# Patient Record
Sex: Female | Born: 1960 | Race: Black or African American | Hispanic: No | Smoking: Former smoker
Health system: Southern US, Community
[De-identification: ages and names within clinical notes are randomized; demographics above are authoritative.]

## PROBLEM LIST (undated history)

## (undated) DIAGNOSIS — I10 Essential (primary) hypertension: Secondary | ICD-10-CM

## (undated) HISTORY — PX: GASTRIC BYPASS: SHX52

---

## 2005-01-12 ENCOUNTER — Ambulatory Visit: Payer: Self-pay | Admitting: Internal Medicine

## 2005-08-24 ENCOUNTER — Ambulatory Visit: Payer: Self-pay | Admitting: Family Medicine

## 2008-02-21 ENCOUNTER — Ambulatory Visit: Payer: Self-pay | Admitting: Family Medicine

## 2012-11-12 ENCOUNTER — Ambulatory Visit: Payer: Self-pay | Admitting: Physician Assistant

## 2012-11-12 LAB — RAPID STREP-A WITH REFLX: Micro Text Report: NEGATIVE

## 2013-11-25 ENCOUNTER — Ambulatory Visit: Payer: Self-pay

## 2013-12-27 ENCOUNTER — Ambulatory Visit: Payer: Self-pay | Admitting: Family Medicine

## 2014-07-30 ENCOUNTER — Other Ambulatory Visit: Payer: Self-pay | Admitting: Family Medicine

## 2014-07-30 DIAGNOSIS — I10 Essential (primary) hypertension: Secondary | ICD-10-CM

## 2014-11-02 ENCOUNTER — Other Ambulatory Visit: Payer: Self-pay | Admitting: Family Medicine

## 2014-11-02 DIAGNOSIS — I10 Essential (primary) hypertension: Secondary | ICD-10-CM

## 2014-11-03 ENCOUNTER — Other Ambulatory Visit: Payer: Self-pay

## 2014-12-09 ENCOUNTER — Other Ambulatory Visit: Payer: Self-pay | Admitting: Family Medicine

## 2015-01-05 ENCOUNTER — Encounter: Payer: Self-pay | Admitting: *Deleted

## 2015-01-05 ENCOUNTER — Other Ambulatory Visit: Payer: Self-pay

## 2015-01-05 ENCOUNTER — Ambulatory Visit
Admission: EM | Admit: 2015-01-05 | Discharge: 2015-01-05 | Disposition: A | Discharge status: Home / Self Care | Payer: Federal, State, Local not specified - PPO | Attending: Registered Nurse | Admitting: Registered Nurse

## 2015-01-05 DIAGNOSIS — H6593 Unspecified nonsuppurative otitis media, bilateral: Secondary | ICD-10-CM

## 2015-01-05 DIAGNOSIS — I10 Essential (primary) hypertension: Secondary | ICD-10-CM

## 2015-01-05 DIAGNOSIS — B9789 Other viral agents as the cause of diseases classified elsewhere: Secondary | ICD-10-CM

## 2015-01-05 DIAGNOSIS — J988 Other specified respiratory disorders: Secondary | ICD-10-CM

## 2015-01-05 DIAGNOSIS — B349 Viral infection, unspecified: Secondary | ICD-10-CM

## 2015-01-05 HISTORY — DX: Essential (primary) hypertension: I10

## 2015-01-05 MED ORDER — CLONIDINE HCL 0.1 MG PO TABS
0.1000 mg | ORAL_TABLET | Freq: Once | ORAL | Status: AC
  Administered 2015-01-05: 0.1 mg via ORAL

## 2015-01-05 MED ORDER — FLUTICASONE PROPIONATE 50 MCG/ACT NA SUSP: 1.0000 | Freq: Two times a day (BID) | NASAL | Status: DC

## 2015-01-05 MED ORDER — LOSARTAN POTASSIUM-HCTZ 100-25 MG PO TABS: 1.0000 | ORAL_TABLET | Freq: Every day | ORAL | Status: DC

## 2015-01-05 MED ORDER — SALINE SPRAY 0.65 % NA SOLN: 2.0000 | NASAL | Status: DC

## 2015-01-05 MED ORDER — CARVEDILOL 3.125 MG PO TABS: 3.1250 mg | ORAL_TABLET | Freq: Two times a day (BID) | ORAL | Status: DC

## 2015-01-05 MED ORDER — AMLODIPINE BESY-BENAZEPRIL HCL 10-20 MG PO CAPS: 1.0000 | ORAL_CAPSULE | Freq: Every day | ORAL | Status: DC

## 2015-01-05 MED ORDER — ACETAMINOPHEN 500 MG PO TABS: 500.0000 mg | ORAL_TABLET | Freq: Four times a day (QID) | ORAL | Status: AC | PRN

## 2015-01-05 NOTE — Discharge Instructions (Signed)
DASH Eating Plan °DASH stands for "Dietary Approaches to Stop Hypertension." The DASH eating plan is a healthy eating plan that has been shown to reduce high blood pressure (hypertension). Additional health benefits may include reducing the risk of type 2 diabetes mellitus, heart disease, and stroke. The DASH eating plan may also help with weight loss. °WHAT DO I NEED TO KNOW ABOUT THE DASH EATING PLAN? °For the DASH eating plan, you will follow these general guidelines: °· Choose foods with a percent daily value for sodium of less than 5% (as listed on the food label). °· Use salt-free seasonings or herbs instead of table salt or sea salt. °· Check with your health care provider or pharmacist before using salt substitutes. °· Eat lower-sodium products, often labeled as "lower sodium" or "no salt added." °· Eat fresh foods. °· Eat more vegetables, fruits, and low-fat dairy products. °· Choose whole grains. Look for the word "whole" as the first word in the ingredient list. °· Choose fish and skinless chicken or turkey more often than red meat. Limit fish, poultry, and meat to 6 oz (170 g) each day. °· Limit sweets, desserts, sugars, and sugary drinks. °· Choose heart-healthy fats. °· Limit cheese to 1 oz (28 g) per day. °· Eat more home-cooked food and less restaurant, buffet, and fast food. °· Limit fried foods. °· Cook foods using methods other than frying. °· Limit canned vegetables. If you do use them, rinse them well to decrease the sodium. °· When eating at a restaurant, ask that your food be prepared with less salt, or no salt if possible. °WHAT FOODS CAN I EAT? °Seek help from a dietitian for individual calorie needs. °Grains °Whole grain or whole wheat bread. Brown rice. Whole grain or whole wheat pasta. Quinoa, bulgur, and whole grain cereals. Low-sodium cereals. Corn or whole wheat flour tortillas. Whole grain cornbread. Whole grain crackers. Low-sodium crackers. °Vegetables °Fresh or frozen vegetables  (raw, steamed, roasted, or grilled). Low-sodium or reduced-sodium tomato and vegetable juices. Low-sodium or reduced-sodium tomato sauce and paste. Low-sodium or reduced-sodium canned vegetables.  °Fruits °All fresh, canned (in natural juice), or frozen fruits. °Meat and Other Protein Products °Ground beef (85% or leaner), grass-fed beef, or beef trimmed of fat. Skinless chicken or turkey. Ground chicken or turkey. Pork trimmed of fat. All fish and seafood. Eggs. Dried beans, peas, or lentils. Unsalted nuts and seeds. Unsalted canned beans. °Dairy °Low-fat dairy products, such as skim or 1% milk, 2% or reduced-fat cheeses, low-fat ricotta or cottage cheese, or plain low-fat yogurt. Low-sodium or reduced-sodium cheeses. °Fats and Oils °Tub margarines without trans fats. Light or reduced-fat mayonnaise and salad dressings (reduced sodium). Avocado. Safflower, olive, or canola oils. Natural peanut or almond butter. °Other °Unsalted popcorn and pretzels. °The items listed above may not be a complete list of recommended foods or beverages. Contact your dietitian for more options. °WHAT FOODS ARE NOT RECOMMENDED? °Grains °White bread. White pasta. White rice. Refined cornbread. Bagels and croissants. Crackers that contain trans fat. °Vegetables °Creamed or fried vegetables. Vegetables in a cheese sauce. Regular canned vegetables. Regular canned tomato sauce and paste. Regular tomato and vegetable juices. °Fruits °Dried fruits. Canned fruit in light or heavy syrup. Fruit juice. °Meat and Other Protein Products °Fatty cuts of meat. Ribs, chicken wings, bacon, sausage, bologna, salami, chitterlings, fatback, hot dogs, bratwurst, and packaged luncheon meats. Salted nuts and seeds. Canned beans with salt. °Dairy °Whole or 2% milk, cream, half-and-half, and cream cheese. Whole-fat or sweetened yogurt. Full-fat   cheeses or blue cheese. Nondairy creamers and whipped toppings. Processed cheese, cheese spreads, or cheese  curds. Condiments Onion and garlic salt, seasoned salt, table salt, and sea salt. Canned and packaged gravies. Worcestershire sauce. Tartar sauce. Barbecue sauce. Teriyaki sauce. Soy sauce, including reduced sodium. Steak sauce. Fish sauce. Oyster sauce. Cocktail sauce. Horseradish. Ketchup and mustard. Meat flavorings and tenderizers. Bouillon cubes. Hot sauce. Tabasco sauce. Marinades. Taco seasonings. Relishes. Fats and Oils Butter, stick margarine, lard, shortening, ghee, and bacon fat. Coconut, palm kernel, or palm oils. Regular salad dressings. Other Pickles and olives. Salted popcorn and pretzels. The items listed above may not be a complete list of foods and beverages to avoid. Contact your dietitian for more information. WHERE CAN I FIND MORE INFORMATION? [employer] [facility] [facility], and [place]: ktime[employer] Hypertension, commonly called high blood pressure, is when the force of blood pumping through your arteries is too strong. Your arteries are the blood vessels that carry blood from your heart throughout your body. A blood pressure reading consists of a higher number over a lower number, such as 110/72. The higher number (systolic) is the pressure inside your arteries when your heart pumps. The lower number (diastolic) is the pressure inside your arteries when your heart relaxes. Ideally you want your blood pressure below 120/80. Hypertension forces your heart to work harder to pump blood. Your arteries may become narrow or stiff. Having untreated or uncontrolled hypertension can cause heart attack, stroke, kidney disease, and other problems. RISK FACTORS Some risk factors for high blood pressure are controllable. Others are not.  Risk factors you cannot control include:  Race. You may be at higher risk if you are African American. Age. Risk increases with age. Gender. Men are at higher risk than women before age 68 years. After age 69, women  are at higher risk than men. Risk factors you can control include: Not getting enough exercise or physical activity. Being overweight. Getting too much fat, sugar, calories, or salt in your diet. Drinking too much alcohol. SIGNS AND SYMPTOMS Hypertension does not usually cause signs or symptoms. Extremely high blood pressure (hypertensive crisis) may cause headache, anxiety, shortness of breath, and nosebleed. DIAGNOSIS To check if you have hypertension, your health care provider will measure your blood pressure while you are seated, with your arm held at the level of your heart. It should be measured at least twice using the same arm. Certain conditions can cause a difference in blood pressure between your right and left arms. A blood pressure reading that is higher than normal on one occasion does not mean that you need treatment. If it is not clear whether you have high blood pressure, you may be asked to return on a different day to have your blood pressure checked again. Or, you may be asked to monitor your blood pressure at home for 1 or more weeks. TREATMENT Treating high blood pressure includes making lifestyle changes and possibly taking medicine. Living a healthy lifestyle can help lower high blood pressure. You may need to change some of your habits. Lifestyle changes may include: Following the DASH diet. This diet is high in fruits, vegetables, and whole grains. It is low in salt, red meat, and added sugars. Keep your sodium intake below 2,300 mg per day. Getting at least 30-45 minutes of aerobic exercise at least 4 times per week. Losing weight if necessary. Not smoking. Limiting alcoholic beverages. Learning ways to reduce stress. Your health care provider may prescribe medicine if lifestyle changes are  not enough to get your blood pressure under control, and if one of the following is true: You are 49-47 years of age and your systolic blood pressure is above 140. You are 55 years  of age or older, and your systolic blood pressure is above 150. Your diastolic blood pressure is above 90. You have diabetes, and your systolic blood pressure is over [id number] or your diastolic blood pressure is over 90. You have kidney disease and your blood pressure is above 140/90. You have heart disease and your blood pressure is above 140/90. Your personal target blood pressure may vary depending on your medical conditions, your age, and other factors. HOME CARE INSTRUCTIONS Have your blood pressure rechecked as directed by your health care provider.  Take medicines only as directed by your health care provider. Follow the directions carefully. Blood pressure medicines must be taken as prescribed. The medicine does not work as well when you skip doses. Skipping doses also puts you at risk for problems. Do not smoke.  Monitor your blood pressure at home as directed by your health care provider. SEEK MEDICAL CARE IF:  You think you are having a reaction to medicines taken. You have recurrent headaches or feel dizzy. You have swelling in your ankles. You have trouble with your vision. SEEK IMMEDIATE MEDICAL CARE IF: You develop a severe headache or confusion. You have unusual weakness, numbness, or feel faint. You have severe chest or abdominal pain. You vomit repeatedly. You have trouble breathing. MAKE SURE YOU:  Understand these instructions. Will watch your condition. Will get help right away if you are not doing well or get worse.   This information is not intended to replace advice given to you by your health care provider. Make sure you discuss any questions you have with your health care provider.   Document Released: 01/31/2005 Document Revised: 06/17/2014 Document Reviewed: 11/23/2012 Elsevier Interactive Patient Education [employer]. dash/   This information is not intended to replace advice given to you by your health care provider. Make sure you discuss any  questions you have with your health care provider.   Document Released: 01/20/2011 Document Revised: 02/21/2014 Document Reviewed: 12/05/2012 Elsevier Interactive Patient Education 2016 [employer] Viral Infections A virus is a type of germ. Viruses can cause:  Minor sore throats.  Aches and pains.  Headaches.  Runny nose.  Rashes.  Watery eyes.  Tiredness.  Coughs.  Loss of appetite.  Feeling sick to your stomach (nausea).  Throwing up (vomiting).  Watery poop (diarrhea). HOME CARE   Only take medicines as told by your doctor.  Drink enough water and fluids to keep your pee (urine) clear or pale yellow. Sports drinks are a good choice.  Get plenty of rest and eat healthy. Soups and broths with crackers or rice are fine. GET HELP RIGHT AWAY IF:   You have a very bad headache.  You have shortness of breath.  You have chest pain or neck pain.  You have an unusual rash.  You cannot stop throwing up.  You have watery poop that does not stop.  You cannot keep fluids down.  You or your child has a temperature by mouth above 102 F (38.9 C), not controlled by medicine.  Your baby is older than 3 months with a rectal temperature of 102 F (38.9 C) or higher.  Your baby is 37 months old or younger with a rectal temperature of 100.4 F (38 C) or higher. MAKE SURE YOU:  Understand these instructions.  Will watch this condition.  Will get help right away if you are not doing well or get worse.   This information is not intended to replace advice given to you by your health care provider. Make sure you discuss any questions you have with your health care provider.   Document Released: 01/14/2008 Document Revised: 04/25/2011 Document Reviewed: 07/09/2014 Elsevier Interactive Patient Education 2016 [employer] Pharyngitis Pharyngitis is redness, pain, and swelling (inflammation) of your pharynx.  CAUSES  Pharyngitis is usually caused by infection.  Most of the time, these infections are from viruses (viral) and are part of a cold. However, sometimes pharyngitis is caused by bacteria (bacterial). Pharyngitis can also be caused by allergies. Viral pharyngitis may be spread from person to person by coughing, sneezing, and personal items or utensils (cups, forks, spoons, toothbrushes). Bacterial pharyngitis may be spread from person to person by more intimate contact, such as kissing.  SIGNS AND SYMPTOMS  Symptoms of pharyngitis include:   Sore throat.   Tiredness (fatigue).   Low-grade fever.   Headache.  Joint pain and muscle aches.  Skin rashes.  Swollen lymph nodes.  Plaque-like film on throat or tonsils (often seen with bacterial pharyngitis). DIAGNOSIS  Your health care provider will ask you questions about your illness and your symptoms. Your medical history, along with a physical exam, is often all that is needed to diagnose pharyngitis. Sometimes, a rapid strep test is done. Other lab tests may also be done, depending on the suspected cause.  TREATMENT  Viral pharyngitis will usually get better in 3-4 days without the use of medicine. Bacterial pharyngitis is treated with medicines that kill germs (antibiotics).  HOME CARE INSTRUCTIONS   Drink enough water and fluids to keep your urine clear or pale yellow.   Only take over-the-counter or prescription medicines as directed by your health care provider:   If you are prescribed antibiotics, make sure you finish them even if you start to feel better.   Do not take aspirin.   Get lots of rest.   Gargle with 8 oz of salt water ( tsp of salt per 1 qt of water) as often as every 1-2 hours to soothe your throat.   Throat lozenges (if you are not at risk for choking) or sprays may be used to soothe your throat. SEEK MEDICAL CARE IF:   You have large, tender lumps in your neck.  You have a rash.  You cough up green, yellow-brown, or bloody spit. SEEK IMMEDIATE  MEDICAL CARE IF:   Your neck becomes stiff.  You drool or are unable to swallow liquids.  You vomit or are unable to keep medicines or liquids down.  You have severe pain that does not go away with the use of recommended medicines.  You have trouble breathing (not caused by a stuffy nose). MAKE SURE YOU:   Understand these instructions.  Will watch your condition.  Will get help right away if you are not doing well or get worse.   This information is not intended to replace advice given to you by your health care provider. Make sure you discuss any questions you have with your health care provider.   Document Released: 01/31/2005 Document Revised: 11/21/2012 Document Reviewed: 10/08/2012 Elsevier Interactive Patient Education 2016 [employer]. Otitis Media With Effusion Otitis media with effusion is the presence of fluid in the middle ear. This is a common problem in children, which often follows ear infections. It may be  present for weeks or longer after the infection. Unlike an acute ear infection, otitis media with effusion refers only to fluid behind the ear drum and not infection. Children with repeated ear and sinus infections and allergy problems are the most likely to get otitis media with effusion. CAUSES  The most frequent cause of the fluid buildup is dysfunction of the eustachian tubes. These are the tubes that drain fluid in the ears to the back of the nose (nasopharynx). SYMPTOMS   The main symptom of this condition is hearing loss. As a result, you or your child may:  Listen to the TV at a loud volume.  Not respond to questions.  Ask "what" often when spoken to.  Mistake or confuse one sound or word for another.  There may be a sensation of fullness or pressure but usually not pain. DIAGNOSIS   Your health care provider will diagnose this condition by examining you or your child's ears.  Your health care provider may test the pressure in you or your  child's ear with a tympanometer.  A hearing test may be conducted if the problem persists. TREATMENT   Treatment depends on the duration and the effects of the effusion.  Antibiotics, decongestants, nose drops, and cortisone-type drugs (tablets or nasal spray) may not be helpful.  Children with persistent ear effusions may have delayed language or behavioral problems. Children at risk for developmental delays in hearing, learning, and speech may require referral to a specialist earlier than children not at risk.  You or your child's health care provider may suggest a referral to an ear, nose, and throat surgeon for treatment. The following may help restore normal hearing:  Drainage of fluid.  Placement of ear tubes (tympanostomy tubes).  Removal of adenoids (adenoidectomy). HOME CARE INSTRUCTIONS   Avoid secondhand smoke.  Infants who are breastfed are less likely to have this condition.  Avoid feeding infants while they are lying flat.  Avoid known environmental allergens.  Avoid people who are sick. SEEK MEDICAL CARE IF:   Hearing is not better in 3 months.  Hearing is worse.  Ear pain.  Drainage from the ear.  Dizziness. MAKE SURE YOU:   Understand these instructions.  Will watch your condition.  Will get help right away if you are not doing well or get worse.   This information is not intended to replace advice given to you by your health care provider. Make sure you discuss any questions you have with your health care provider.   Document Released: 03/10/2004 Document Revised: 02/21/2014 Document Reviewed: 08/28/2012 Elsevier Interactive Patient Education [employer].

## 2015-01-05 NOTE — ED Provider Notes (Signed)
CSN: [id number]     Arrival date & time 01/05/15  1104 History   None    Chief Complaint  Patient presents with  . Sore Throat   (Consider location/radiation/quality/duration/timing/severity/associated sxs/prior Treatment) HPI Comments: Married african [place] female here for evaluation of sore throat that started last night, cold symptoms sneezing, congestion last week has been taking zyrtec and nyquil wihtout relief.  Out of her blood pressure medications x 1 week called [facility] and they stated she needed to see PCM for refill of all of them  Patient reported goal DBP 80 and 1 month ago when taking medications 120/79 has noted some shortness of breath due to cold symptoms/occasional cough nonproductive.  Has noted post nasal drip.  PMHx obesity, hypertension, cardiomegaly  PSH gastric bypass  Patient is a 54 y.o. female presenting with pharyngitis. The history is provided by the patient.  Sore Throat This is a new problem. The current episode started yesterday. The problem occurs constantly. The problem has been gradually worsening. Pertinent negatives include no chest pain, no abdominal pain, no headaches and no shortness of breath. The symptoms are aggravated by eating and drinking. Nothing relieves the symptoms. She has tried rest, food and water for the symptoms. The treatment provided no relief.    Past Medical History  Diagnosis Date  . Hypertension    Past Surgical History  Procedure Laterality Date  . Gastric bypass     Family History  Problem Relation Age of Onset  . Hypertension Mother   . Diabetes Mother   . Hypertension Father   . Diabetes Father    Social History  Substance Use Topics  . Smoking status: Former Research scientist (life sciences)  . Smokeless tobacco: None  . Alcohol Use: 1.2 - 1.8 oz/week    2-3 Glasses of wine per week   OB History    No data available     Review of Systems  Constitutional: Negative for fever, chills, diaphoresis, activity change, appetite change, fatigue  and unexpected weight change.  HENT: Positive for congestion, postnasal drip, sneezing and sore throat. Negative for dental problem, drooling, ear discharge, ear pain, facial swelling, hearing loss, mouth sores, nosebleeds, rhinorrhea, sinus pressure, tinnitus, trouble swallowing and voice change.   Eyes: Negative for photophobia, pain, discharge, redness, itching and visual disturbance.  Respiratory: Positive for cough. Negative for choking, chest tightness, shortness of breath, wheezing and stridor.   Cardiovascular: Negative for chest pain, palpitations and leg swelling.  Gastrointestinal: Negative for nausea, vomiting, abdominal pain, diarrhea, constipation, blood in stool and abdominal distention.  Endocrine: Negative for cold intolerance and heat intolerance.  Genitourinary: Negative for dysuria, hematuria and difficulty urinating.  Musculoskeletal: Negative for myalgias, back pain, joint swelling, arthralgias, gait problem, neck pain and neck stiffness.  Skin: Negative for color change, pallor, rash and wound.  Allergic/Immunologic: Positive for environmental allergies. Negative for food allergies.  Neurological: Negative for dizziness, tremors, seizures, syncope, facial asymmetry, speech difficulty, weakness, light-headedness, numbness and headaches.  Hematological: Negative for adenopathy. Does not bruise/bleed easily.  Psychiatric/Behavioral: Negative for behavioral problems, confusion, sleep disturbance and agitation.    Allergies  Review of patient's allergies indicates no known allergies.  Home Medications   Prior to Admission medications   Medication Sig Start Date End Date Taking? Authorizing Provider  acetaminophen (TYLENOL) 500 MG tablet Take 1 tablet (500 mg total) by mouth every 6 (six) hours as needed for mild pain, moderate pain, fever or headache. 01/05/15 01/12/15  Olen Cordial, NP  fluticasone (FLONASE) 50  MCG/ACT nasal spray Place 1 spray into both nostrils 2  (two) times daily. 01/05/15 01/26/15  Olen Cordial, NP  losartan-hydrochlorothiazide (HYZAAR) 100-25 MG tablet Take 1 tablet by mouth daily. 01/05/15   Olen Cordial, NP  sodium chloride (OCEAN) 0.65 % SOLN nasal spray Place 2 sprays into both nostrils every 2 (two) hours while awake. 01/05/15   Olen Cordial, NP   Meds Ordered and Administered this Visit   Medications  cloNIDine (CATAPRES) tablet 0.1 mg (0.1 mg Oral Given 01/05/15 1255)    BP 180/104 mmHg  Pulse 83  Temp(Src) 98.2 F (36.8 C) (Oral)  Resp 20  Ht 5\' 3"  (1.6 m)  Wt 175 lb (79.379 kg)  BMI 31.01 kg/m2  SpO2 100% No data found.   Physical Exam  Constitutional: She is oriented to person, place, and time. She appears well-developed and well-nourished. She is active and cooperative.  Non-toxic appearance. She does not have a sickly appearance. She appears ill. No distress.  HENT:  Head: Normocephalic and atraumatic.  Right Ear: Hearing, external ear and ear canal normal. A middle ear effusion is present.  Left Ear: Hearing, external ear and ear canal normal. A middle ear effusion is present.  Nose: Mucosal edema and rhinorrhea present. No nose lacerations, sinus tenderness, nasal deformity, septal deviation or nasal septal hematoma. No epistaxis.  No foreign bodies. Right sinus exhibits no maxillary sinus tenderness and no frontal sinus tenderness. Left sinus exhibits no maxillary sinus tenderness and no frontal sinus tenderness.  Mouth/Throat: Uvula is midline and mucous membranes are normal. Mucous membranes are not pale, not dry and not cyanotic. She does not have dentures. No oral lesions. No trismus in the jaw. Normal dentition. No dental abscesses, uvula swelling, lacerations or dental caries. Posterior oropharyngeal edema and posterior oropharyngeal erythema present. No oropharyngeal exudate or tonsillar abscesses.  Bilateral TMs with air fluid level clear; cobblestoning posterior pharynx; bilateral nasal  turbinates with edema/erythema clear discharge  Eyes: Conjunctivae, EOM and lids are normal. Pupils are equal, round, and reactive to light. Right eye exhibits no chemosis, no discharge, no exudate and no hordeolum. No foreign body present in the right eye. Left eye exhibits no chemosis, no discharge, no exudate and no hordeolum. No foreign body present in the left eye. Right conjunctiva is not injected. Right conjunctiva has no hemorrhage. Left conjunctiva is not injected. Left conjunctiva has no hemorrhage. No scleral icterus. Right eye exhibits normal extraocular motion and no nystagmus. Left eye exhibits normal extraocular motion and no nystagmus. Right pupil is round and reactive. Left pupil is round and reactive. Pupils are equal.  Neck: Trachea normal and normal range of motion. Neck supple. No tracheal tenderness, no spinous process tenderness and no muscular tenderness present. No rigidity. No tracheal deviation, no edema, no erythema and normal range of motion present. No thyroid mass and no thyromegaly present.  Cardiovascular: Normal rate, regular rhythm, S1 normal, S2 normal, normal heart sounds and intact distal pulses.  PMI is not displaced.  Exam reveals no gallop and no friction rub.   No murmur heard. Pulses:      Radial pulses are 2+ on the right side, and 2+ on the left side.  Pulmonary/Chest: Effort normal and breath sounds normal. No accessory muscle usage or stridor. No respiratory distress. She has no decreased breath sounds. She has no wheezes. She has no rhonchi. She has no rales. She exhibits no tenderness.  Negative egophany all fields  Abdominal: Soft. She exhibits no  distension.  Musculoskeletal: Normal range of motion. She exhibits no edema or tenderness.       Right shoulder: Normal.       Left shoulder: Normal.       Right hip: Normal.       Left hip: Normal.       Right knee: Normal.       Left knee: Normal.       Cervical back: Normal.       Right hand: Normal.        Left hand: Normal.  Lymphadenopathy:       Head (right side): No submental, no submandibular, no tonsillar, no preauricular, no posterior auricular and no occipital adenopathy present.       Head (left side): No submental, no submandibular, no tonsillar, no preauricular, no posterior auricular and no occipital adenopathy present.    She has no cervical adenopathy.       Right cervical: No superficial cervical, no deep cervical and no posterior cervical adenopathy present.      Left cervical: No superficial cervical, no deep cervical and no posterior cervical adenopathy present.  Neurological: She is alert and oriented to person, place, and time. She has normal strength. She is not disoriented. She displays no atrophy and no tremor. No cranial nerve deficit or sensory deficit. She exhibits normal muscle tone. She displays no seizure activity. Coordination and gait normal. GCS eye subscore is 4. GCS verbal subscore is 5. GCS motor subscore is 6.  Skin: Skin is warm, dry and intact. No abrasion, no bruising, no burn, no ecchymosis, no laceration, no lesion, no petechiae and no rash noted. She is not diaphoretic. No cyanosis or erythema. No pallor. Nails show no clubbing.  Psychiatric: She has a normal mood and affect. Her speech is normal and behavior is normal. Judgment and thought content normal. Cognition and memory are normal.  Nursing note and vitals reviewed.   ED Course  Procedures (including critical care time)  Labs Review Labs Reviewed - No data to display  Imaging Review No results found.  Filed Vitals:   01/05/15 1250 01/05/15 1337  BP: 190/109 180/104  Pulse:    Temp:    Resp:     1255 catapres 0.1mg  po administered by RN Nedra Hai.  Repeat VSS.  ER precautions discussed with patient.  Friend driving and she will be passenger to [place] to visit family this week/weekend.  Patient verbalized understanding of information/instructions, agreed with plan of care and had no  further questions at this time. MDM   1. Essential hypertension   2. Viral respiratory illness   3. Otitis media with effusion, bilateral   refilled blood pressure medications had been out 1 week going out of town tonight through the holidays.  Discussed with patient to contact PCM today to schedule follow up appt for her return to town.  Patient due annual visit per chart review with Dr Ronnald Ramp last visit 27 Dec 2013.  Contacted [facility] [place] and verified patient medications/doses and refills remaining.  Patient only required refil of hyzaar carvedilol and amlodipine both had refills remaining/available.  Non-responsive to catapres in clinic today patient to [facility] and take upon arrival home tonight.  If chest pain, shortness of breath, worst headache of her life to go to ER for re-evaluation.  Avoid adding salt to her foods/avoid concentrated salt in diet.  Continue to monitor blood pressure at home and maintain log of blood pressure and pulse  to bring to follow up appointments.  Continue low sodium diet and exercise program.  Recommended weight loss/weight maintenance to BMI 20-25.  Return to the clinic if any new symptoms.  Patient verbalized agreement and understanding of treatment plan and had no further questions at this time.   P2:  Diet and Exercise specific for HTN  Supportive treatment.   No evidence of invasive bacterial infection, non toxic and well hydrated.  This is most likely self limiting viral infection.  I do not see where any further testing or imaging is necessary at this time.   I will suggest supportive care, rest, good hygiene and encourage the patient to take adequate fluids.  The patient is to return to clinic or EMERGENCY ROOM if symptoms worsen or change significantly e.g. ear pain, fever, purulent discharge from ears or bleeding.  Exitcare handout on otitis media with effusion given to patient.  Patient verbalized agreement and understanding of treatment  plan.    Suspect Viral illness: no evidence of invasive bacterial infection, non toxic and well hydrated.  This is most likely self limiting viral infection.  I do not see where any further testing or imaging is necessary at this time.   I will suggest supportive care, rest, good hygiene and encourage the patient to take adequate fluids.  Does not require work excuse.  Notified patient staff will call with culture results once available next 48+ hours.  Avoid Sudafed, coradedin, OTC cough and cold formulations due to hypertension. flonase 1 spray each nostril BID prn, nasal saline 1-2 sprays each nostril prn q2h, tylenol 1000mg  po QID prn.  Avoid motrin/naproxen use as counteracts blood pressure medications.  Discussed honey with lemon and salt water gargles for comfort also.  The patient is to return to clinic or EMERGENCY ROOM if symptoms worsen or change significantly e.g. fever, lethargy, SOB, wheezing.  Exitcare handout on viral illness given to patient.  Patient verbalized agreement and understanding of treatment plan.     Olen Cordial, NP 01/05/15 2148

## 2015-01-05 NOTE — ED Notes (Signed)
In today for cold like symptons and sore throat but when took bp in both arms bp extremly elevated.  States has been out of bp meds.

## 2015-01-27 ENCOUNTER — Ambulatory Visit (INDEPENDENT_AMBULATORY_CARE_PROVIDER_SITE_OTHER): Payer: Federal, State, Local not specified - PPO | Admitting: Family Medicine

## 2015-01-27 ENCOUNTER — Encounter: Payer: Self-pay | Admitting: Family Medicine

## 2015-01-27 VITALS — BP 140/100 | HR 72 | Ht 63.0 in | Wt 189.0 lb

## 2015-01-27 DIAGNOSIS — I10 Essential (primary) hypertension: Secondary | ICD-10-CM | POA: Diagnosis not present

## 2015-01-27 MED ORDER — CARVEDILOL 3.125 MG PO TABS: 3.1250 mg | ORAL_TABLET | Freq: Every day | ORAL | Status: DC

## 2015-01-27 MED ORDER — AMLODIPINE BESYLATE 10 MG PO TABS: 10.0000 mg | ORAL_TABLET | Freq: Every day | ORAL | Status: DC

## 2015-01-27 MED ORDER — LOSARTAN POTASSIUM-HCTZ 100-25 MG PO TABS: 1.0000 | ORAL_TABLET | Freq: Every day | ORAL | Status: DC

## 2015-01-27 NOTE — Progress Notes (Signed)
Name: Alyssa Hunt   MRN: [id number]    DOB: 03-06-1960   Date:01/27/2015       Progress Note  Subjective  Chief Complaint  Chief Complaint  Patient presents with  . Hypertension    Hypertension This is a chronic problem. The current episode started in the past 7 days. The problem has been gradually worsening since onset. The problem is uncontrolled. Pertinent negatives include no anxiety, blurred vision, chest pain, headaches, malaise/fatigue, neck pain, orthopnea, palpitations, peripheral edema, PND, shortness of breath or sweats. There are no associated agents to hypertension. There are no known risk factors for coronary artery disease. Past treatments include ACE inhibitors, angiotensin blockers, beta blockers, calcium channel blockers and diuretics. The current treatment provides no improvement. Compliance problems: medication dosage.  There is no history of angina, kidney disease, CAD/MI, CVA, heart failure, left ventricular hypertrophy, PVD, renovascular disease or retinopathy. There is no history of chronic renal disease or a hypertension causing med.    No problem-specific assessment & plan notes found for this encounter.   Past Medical History  Diagnosis Date  . Hypertension     Past Surgical History  Procedure Laterality Date  . Gastric bypass      Family History  Problem Relation Age of Onset  . Hypertension Mother   . Diabetes Mother   . Hypertension Father   . Diabetes Father     Social History   Social History  . Marital Status: Married    Spouse Name: N/A  . Number of Children: N/A  . Years of Education: N/A   Occupational History  . Not on file.   Social History Main Topics  . Smoking status: Former Research scientist (life sciences)  . Smokeless tobacco: Not on file  . Alcohol Use: 1.2 - 1.8 oz/week    2-3 Glasses of wine per week  . Drug Use: Not on file  . Sexual Activity: Yes    Birth Control/ Protection: Post-menopausal   Other Topics Concern  . Not on  file   Social History Narrative    No Known Allergies   Review of Systems  Constitutional: Negative for fever, chills, weight loss and malaise/fatigue.  HENT: Negative for ear discharge, ear pain and sore throat.   Eyes: Negative for blurred vision.  Respiratory: Negative for cough, sputum production, shortness of breath and wheezing.   Cardiovascular: Negative for chest pain, palpitations, orthopnea, leg swelling and PND.  Gastrointestinal: Negative for heartburn, nausea, abdominal pain, diarrhea, constipation, blood in stool and melena.  Genitourinary: Negative for dysuria, urgency, frequency and hematuria.  Musculoskeletal: Negative for myalgias, back pain, joint pain and neck pain.  Skin: Negative for rash.  Neurological: Negative for dizziness, tingling, sensory change, focal weakness and headaches.  Endo/Heme/Allergies: Negative for environmental allergies and polydipsia. Does not bruise/bleed easily.  Psychiatric/Behavioral: Negative for depression and suicidal ideas. The patient is not nervous/anxious and does not have insomnia.      Objective  Filed Vitals:   01/27/15 1553  BP: 140/100  Pulse: 72  Height: 5\' 3"  (1.6 m)  Weight: 189 lb (85.73 kg)    Physical Exam  Constitutional: She is well-developed, well-nourished, and in no distress. No distress.  HENT:  Head: Normocephalic and atraumatic.  Right Ear: External ear normal.  Left Ear: External ear normal.  Nose: Nose normal.  Mouth/Throat: Oropharynx is clear and moist.  Eyes: Conjunctivae and EOM are normal. Pupils are equal, round, and reactive to light. Right eye exhibits no discharge. Left eye exhibits no  discharge.  Neck: Normal range of motion. Neck supple. No JVD present. No thyromegaly present.  Cardiovascular: Normal rate, regular rhythm, normal heart sounds and intact distal pulses.  Exam reveals no gallop and no friction rub.   No murmur heard. Pulmonary/Chest: Effort normal and breath sounds normal.   Abdominal: Soft. Bowel sounds are normal. She exhibits no mass. There is no tenderness. There is no guarding.  Musculoskeletal: Normal range of motion. She exhibits no edema.  Lymphadenopathy:    She has no cervical adenopathy.  Neurological: She is alert. She has normal reflexes.  Skin: Skin is warm and dry. She is not diaphoretic.  Psychiatric: Mood and affect normal.      Assessment & Plan  Problem List Items Addressed This Visit    None    Visit Diagnoses    Essential hypertension    -  Primary    Relevant Medications    losartan-hydrochlorothiazide (HYZAAR) 100-25 MG tablet    carvedilol (COREG) 3.125 MG tablet    amLODipine (NORVASC) 10 MG tablet         Dr. [facility]  01/27/2015

## 2015-02-03 ENCOUNTER — Other Ambulatory Visit: Payer: Self-pay | Admitting: Family Medicine

## 2015-03-10 ENCOUNTER — Encounter: Payer: Self-pay | Admitting: Family Medicine

## 2015-03-10 ENCOUNTER — Ambulatory Visit (INDEPENDENT_AMBULATORY_CARE_PROVIDER_SITE_OTHER): Payer: Federal, State, Local not specified - PPO | Admitting: Family Medicine

## 2015-03-10 VITALS — BP 140/102 | HR 80 | Ht 63.0 in | Wt 188.0 lb

## 2015-03-10 DIAGNOSIS — I10 Essential (primary) hypertension: Secondary | ICD-10-CM | POA: Insufficient documentation

## 2015-03-10 DIAGNOSIS — G4700 Insomnia, unspecified: Secondary | ICD-10-CM

## 2015-03-10 DIAGNOSIS — S63602A Unspecified sprain of left thumb, initial encounter: Secondary | ICD-10-CM | POA: Diagnosis not present

## 2015-03-10 MED ORDER — CARVEDILOL 3.125 MG PO TABS
3.1250 mg | ORAL_TABLET | Freq: Two times a day (BID) | ORAL | Status: DC
Start: 2015-03-10 — End: 2015-04-24

## 2015-03-10 MED ORDER — LOSARTAN POTASSIUM-HCTZ 100-25 MG PO TABS: 1.0000 | ORAL_TABLET | Freq: Every day | ORAL | Status: DC

## 2015-03-10 MED ORDER — AMLODIPINE BESYLATE 10 MG PO TABS: 10.0000 mg | ORAL_TABLET | Freq: Every day | ORAL | Status: DC

## 2015-03-10 NOTE — Patient Instructions (Signed)
Insomnia Insomnia is a sleep disorder that makes it difficult to fall asleep or to stay asleep. Insomnia can cause tiredness (fatigue), low energy, difficulty concentrating, mood swings, and poor performance at work or school.  There are three different ways to classify insomnia:  Difficulty falling asleep.  Difficulty staying asleep.  Waking up too early in the morning. Any type of insomnia can be long-term (chronic) or short-term (acute). Both are common. Short-term insomnia usually lasts for three months or less. Chronic insomnia occurs at least three times a week for longer than three months. CAUSES  Insomnia may be caused by another condition, situation, or substance, such as:  Anxiety.  Certain medicines.  Gastroesophageal reflux disease (GERD) or other gastrointestinal conditions.  Asthma or other breathing conditions.  Restless legs syndrome, sleep apnea, or other sleep disorders.  Chronic pain.  Menopause. This may include hot flashes.  Stroke.  Abuse of alcohol, tobacco, or illegal drugs.  Depression.  Caffeine.   Neurological disorders, such as Alzheimer disease.  An overactive thyroid (hyperthyroidism). The cause of insomnia may not be known. RISK FACTORS Risk factors for insomnia include:  Gender. Women are more commonly affected than men.  Age. Insomnia is more common as you get older.  Stress. This may involve your professional or personal life.  Income. Insomnia is more common in people with lower income.  Lack of exercise.   Irregular work schedule or night shifts.  Traveling between different time zones. SIGNS AND SYMPTOMS If you have insomnia, trouble falling asleep or trouble staying asleep is the main symptom. This may lead to other symptoms, such as:  Feeling fatigued.  Feeling nervous about going to sleep.  Not feeling rested in the morning.  Having trouble concentrating.  Feeling irritable, anxious, or depressed. TREATMENT   Treatment for insomnia depends on the cause. If your insomnia is caused by an underlying condition, treatment will focus on addressing the condition. Treatment may also include:   Medicines to help you sleep.  Counseling or therapy.  Lifestyle adjustments. HOME CARE INSTRUCTIONS   Take medicines only as directed by your health care provider.  Keep regular sleeping and waking hours. Avoid naps.  Keep a sleep diary to help you and your health care provider figure out what could be causing your insomnia. Include:   When you sleep.  When you wake up during the night.  How well you sleep.   How rested you feel the next day.  Any side effects of medicines you are taking.  What you eat and drink.   Make your bedroom a comfortable place where it is easy to fall asleep:  Put up shades or special blackout curtains to block light from outside.  Use a white noise machine to block noise.  Keep the temperature cool.   Exercise regularly as directed by your health care provider. Avoid exercising right before bedtime.  Use relaxation techniques to manage stress. Ask your health care provider to suggest some techniques that may work well for you. These may include:  Breathing exercises.  Routines to release muscle tension.  Visualizing peaceful scenes.  Cut back on alcohol, caffeinated beverages, and cigarettes, especially close to bedtime. These can disrupt your sleep.  Do not overeat or eat spicy foods right before bedtime. This can lead to digestive discomfort that can make it hard for you to sleep.  Limit screen use before bedtime. This includes:  Watching TV.  Using your smartphone, tablet, and computer.  Stick to a routine. This   can help you fall asleep faster. Try to do a quiet activity, brush your teeth, and go to bed at the same time each night.  Get out of bed if you are still awake after 15 minutes of trying to sleep. Keep the lights down, but try reading or  doing a quiet activity. When you feel sleepy, go back to bed.  Make sure that you drive carefully. Avoid driving if you feel very sleepy.  Keep all follow-up appointments as directed by your health care provider. This is important. SEEK MEDICAL CARE IF:   You are tired throughout the day or have trouble in your daily routine due to sleepiness.  You continue to have sleep problems or your sleep problems get worse. SEEK IMMEDIATE MEDICAL CARE IF:   You have serious thoughts about hurting yourself or someone else.   This information is not intended to replace advice given to you by your health care provider. Make sure you discuss any questions you have with your health care provider.   Document Released: 01/29/2000 Document Revised: 10/22/2014 Document Reviewed: 11/01/2013 [employer]vier Interactive Patient Education 2016 [employer]  

## 2015-03-10 NOTE — Progress Notes (Signed)
Name: Alyssa Hunt   MRN: [id number]    DOB: 1960/03/02   Date:03/10/2015       Progress Note  Subjective  Chief Complaint  Chief Complaint  Patient presents with  . Hypertension    follow up on B/P   . Hand Pain    bilateral but worse in the L) hand    Hypertension This is a chronic problem. The current episode started more than 1 year ago. The problem has been waxing and waning since onset. The problem is uncontrolled. Pertinent negatives include no anxiety, blurred vision, chest pain, headaches, malaise/fatigue, neck pain, orthopnea, palpitations, peripheral edema, PND, shortness of breath or sweats. There are no associated agents to hypertension. There are no known risk factors for coronary artery disease. Past treatments include angiotensin blockers, calcium channel blockers, beta blockers and diuretics. The current treatment provides moderate improvement. There is no history of angina, kidney disease, CAD/MI, CVA, heart failure, left ventricular hypertrophy, PVD, renovascular disease or retinopathy. There is no history of chronic renal disease or a hypertension causing med.  Hand Pain  The incident occurred more than 1 week ago. There was no injury mechanism. The pain is present in the left hand. The quality of the pain is described as aching. Pertinent negatives include no chest pain or tingling. The symptoms are aggravated by movement. She has tried nothing for the symptoms. The treatment provided no relief.    No problem-specific assessment & plan notes found for this encounter.   Past Medical History  Diagnosis Date  . Hypertension     Past Surgical History  Procedure Laterality Date  . Gastric bypass      Family History  Problem Relation Age of Onset  . Hypertension Mother   . Diabetes Mother   . Hypertension Father   . Diabetes Father     Social History   Social History  . Marital Status: Married    Spouse Name: N/A  . Number of Children: N/A  .  Years of Education: N/A   Occupational History  . Not on file.   Social History Main Topics  . Smoking status: Former Research scientist (life sciences)  . Smokeless tobacco: Not on file  . Alcohol Use: 1.2 - 1.8 oz/week    2-3 Glasses of wine per week  . Drug Use: Not on file  . Sexual Activity: Yes    Birth Control/ Protection: Post-menopausal   Other Topics Concern  . Not on file   Social History Narrative    No Known Allergies   Review of Systems  Constitutional: Negative for fever, chills, weight loss and malaise/fatigue.  HENT: Negative for ear discharge, ear pain and sore throat.   Eyes: Negative for blurred vision.  Respiratory: Negative for cough, hemoptysis, sputum production, shortness of breath and wheezing.   Cardiovascular: Negative for chest pain, palpitations, orthopnea, leg swelling and PND.  Gastrointestinal: Negative for heartburn, nausea, abdominal pain, diarrhea, constipation, blood in stool and melena.  Genitourinary: Negative for dysuria, urgency, frequency and hematuria.  Musculoskeletal: Positive for joint pain. Negative for myalgias, back pain and neck pain.  Skin: Negative for rash.  Neurological: Negative for dizziness, tingling, sensory change, focal weakness and headaches.  Endo/Heme/Allergies: Negative for environmental allergies and polydipsia. Does not bruise/bleed easily.  Psychiatric/Behavioral: Negative for depression and suicidal ideas. The patient is not nervous/anxious and does not have insomnia.      Objective  Filed Vitals:   03/10/15 1612  BP: 140/102  Pulse: 80  Height: 5\' 3"  (  1.6 m)  Weight: 188 lb (85.276 kg)    Physical Exam  Constitutional: She is well-developed, well-nourished, and in no distress. No distress.  HENT:  Head: Normocephalic and atraumatic.  Right Ear: External ear normal.  Left Ear: External ear normal.  Nose: Nose normal.  Mouth/Throat: Oropharynx is clear and moist.  Eyes: Conjunctivae and EOM are normal. Pupils are equal,  round, and reactive to light. Right eye exhibits no discharge. Left eye exhibits no discharge.  Neck: Normal range of motion. Neck supple. No JVD present. No thyromegaly present.  Cardiovascular: Normal rate, regular rhythm, normal heart sounds and intact distal pulses.  Exam reveals no gallop and no friction rub.   No murmur heard. Pulmonary/Chest: Effort normal and breath sounds normal. No respiratory distress. She has no wheezes. She has no rales. She exhibits no tenderness.  Abdominal: Soft. Bowel sounds are normal. She exhibits no mass. There is no tenderness. There is no guarding.  Musculoskeletal: Normal range of motion. She exhibits tenderness. She exhibits no edema.       Left hand: She exhibits tenderness. Normal sensation noted. Normal strength noted.       Hands: Lymphadenopathy:    She has no cervical adenopathy.  Neurological: She is alert. She has normal reflexes.  Skin: Skin is warm and dry. She is not diaphoretic.  Psychiatric: Mood and affect normal.      Assessment & Plan  Problem List Items Addressed This Visit      Cardiovascular and Mediastinum   Essential hypertension - Primary   Relevant Medications   amLODipine (NORVASC) 10 MG tablet   carvedilol (COREG) 3.125 MG tablet   losartan-hydrochlorothiazide (HYZAAR) 100-25 MG tablet    Other Visit Diagnoses    Insomnia        Left thumb sprain, initial encounter             Dr. Mac[facility]  03/10/2015

## 2015-04-21 ENCOUNTER — Ambulatory Visit: Payer: Federal, State, Local not specified - PPO | Admitting: Family Medicine

## 2015-04-24 ENCOUNTER — Ambulatory Visit (INDEPENDENT_AMBULATORY_CARE_PROVIDER_SITE_OTHER): Payer: Federal, State, Local not specified - PPO | Admitting: Family Medicine

## 2015-04-24 ENCOUNTER — Encounter: Payer: Self-pay | Admitting: Family Medicine

## 2015-04-24 VITALS — BP 138/88 | HR 64 | Ht 63.0 in | Wt 196.0 lb

## 2015-04-24 DIAGNOSIS — I10 Essential (primary) hypertension: Secondary | ICD-10-CM | POA: Diagnosis not present

## 2015-04-24 MED ORDER — AMLODIPINE BESYLATE 10 MG PO TABS: 10.0000 mg | ORAL_TABLET | Freq: Every day | ORAL | Status: DC

## 2015-04-24 MED ORDER — CARVEDILOL 3.125 MG PO TABS: 3.1250 mg | ORAL_TABLET | Freq: Two times a day (BID) | ORAL | Status: DC

## 2015-04-24 MED ORDER — LOSARTAN POTASSIUM-HCTZ 100-25 MG PO TABS: 1.0000 | ORAL_TABLET | Freq: Every day | ORAL | Status: DC

## 2015-04-24 NOTE — Progress Notes (Signed)
Name: Alyssa Hunt   MRN: [id number]    DOB: 1960/09/08   Date:04/24/2015       Progress Note  Subjective  Chief Complaint  Chief Complaint  Patient presents with  . Hypertension    follow up on B/P med    Hypertension This is a chronic problem. The current episode started more than 1 year ago. The problem has been gradually improving since onset. The problem is controlled. Pertinent negatives include no anxiety, blurred vision, chest pain, headaches, malaise/fatigue, neck pain, orthopnea, palpitations, peripheral edema, PND, shortness of breath or sweats. There are no associated agents to hypertension. Past treatments include angiotensin blockers, calcium channel blockers, diuretics and beta blockers. The current treatment provides mild improvement. There are no compliance problems.  There is no history of angina, kidney disease, CAD/MI, CVA, heart failure, left ventricular hypertrophy, PVD, renovascular disease or retinopathy. There is no history of chronic renal disease or a hypertension causing med.    No problem-specific assessment & plan notes found for this encounter.   Past Medical History  Diagnosis Date  . Hypertension     Past Surgical History  Procedure Laterality Date  . Gastric bypass      Family History  Problem Relation Age of Onset  . Hypertension Mother   . Diabetes Mother   . Hypertension Father   . Diabetes Father     Social History   Social History  . Marital Status: Married    Spouse Name: N/A  . Number of Children: N/A  . Years of Education: N/A   Occupational History  . Not on file.   Social History Main Topics  . Smoking status: Former Research scientist (life sciences)  . Smokeless tobacco: Not on file  . Alcohol Use: 1.2 - 1.8 oz/week    2-3 Glasses of wine per week  . Drug Use: Not on file  . Sexual Activity: Yes    Birth Control/ Protection: Post-menopausal   Other Topics Concern  . Not on file   Social History Narrative    No Known  Allergies   Review of Systems  Constitutional: Negative for fever, chills, weight loss and malaise/fatigue.  HENT: Negative for ear discharge, ear pain and sore throat.   Eyes: Negative for blurred vision.  Respiratory: Negative for cough, sputum production, shortness of breath and wheezing.   Cardiovascular: Negative for chest pain, palpitations, orthopnea, leg swelling and PND.  Gastrointestinal: Negative for heartburn, nausea, abdominal pain, diarrhea, constipation, blood in stool and melena.  Genitourinary: Negative for dysuria, urgency, frequency and hematuria.  Musculoskeletal: Negative for myalgias, back pain, joint pain and neck pain.  Skin: Negative for rash.  Neurological: Negative for dizziness, tingling, sensory change, focal weakness and headaches.  Endo/Heme/Allergies: Negative for environmental allergies and polydipsia. Does not bruise/bleed easily.  Psychiatric/Behavioral: Negative for depression and suicidal ideas. The patient is not nervous/anxious and does not have insomnia.      Objective  Filed Vitals:   04/24/15 0920  BP: 138/88  Pulse: 64  Height: 5\' 3"  (1.6 m)  Weight: 196 lb (88.905 kg)    Physical Exam  Constitutional: She is well-developed, well-nourished, and in no distress. No distress.  HENT:  Head: Normocephalic and atraumatic.  Right Ear: External ear normal.  Left Ear: External ear normal.  Nose: Nose normal.  Mouth/Throat: Oropharynx is clear and moist.  Eyes: Conjunctivae and EOM are normal. Pupils are equal, round, and reactive to light. Right eye exhibits no discharge. Left eye exhibits no discharge.  Neck:  Normal range of motion. Neck supple. No JVD present. No thyromegaly present.  Cardiovascular: Normal rate, regular rhythm, normal heart sounds and intact distal pulses.  Exam reveals no gallop and no friction rub.   No murmur heard. Pulmonary/Chest: Effort normal and breath sounds normal.  Abdominal: Soft. Bowel sounds are normal. She  exhibits no mass. There is no tenderness. There is no guarding.  Musculoskeletal: Normal range of motion. She exhibits no edema.  Lymphadenopathy:    She has no cervical adenopathy.  Neurological: She is alert.  Skin: Skin is warm and dry. She is not diaphoretic.  Psychiatric: Mood and affect normal.      Assessment & Plan  Problem List Items Addressed This Visit      Cardiovascular and Mediastinum   Essential hypertension - Primary   Relevant Medications   carvedilol (COREG) 3.125 MG tablet   amLODipine (NORVASC) 10 MG tablet   losartan-hydrochlorothiazide (HYZAAR) 100-25 MG tablet   Other Relevant Orders   Renal Function Panel        Dr. Otilio Miu [facility]  04/24/2015

## 2015-04-25 LAB — RENAL FUNCTION PANEL
ALBUMIN: 3.8 g/dL (ref 3.5–5.5)
BUN/Creatinine Ratio: 19 (ref 9–23)
BUN: 22 mg/dL (ref 6–24)
CALCIUM: 9 mg/dL (ref 8.7–10.2)
CHLORIDE: 101 mmol/L (ref 96–106)
CO2: 26 mmol/L (ref 18–29)
Creatinine, Ser: 1.15 mg/dL — ABNORMAL HIGH (ref 0.57–1.00)
GFR calc non Af Amer: 54 mL/min/{1.73_m2} — ABNORMAL LOW (ref >59)
GFR, EST AFRICAN AMERICAN: 62 mL/min/{1.73_m2} (ref >59)
GLUCOSE: 130 mg/dL — AB (ref 65–99)
POTASSIUM: 3.9 mmol/L (ref 3.5–5.2)
Phosphorus: 3.2 mg/dL (ref 2.5–4.5)
Sodium: 141 mmol/L (ref 134–144)

## 2015-05-10 ENCOUNTER — Other Ambulatory Visit: Payer: Self-pay | Admitting: Family Medicine

## 2015-07-26 ENCOUNTER — Ambulatory Visit
Admission: EM | Admit: 2015-07-26 | Discharge: 2015-07-26 | Disposition: A | Discharge status: Home / Self Care | Payer: Federal, State, Local not specified - PPO | Attending: Family Medicine | Admitting: Family Medicine

## 2015-07-26 DIAGNOSIS — J0110 Acute frontal sinusitis, unspecified: Secondary | ICD-10-CM

## 2015-07-26 MED ORDER — AMOXICILLIN 875 MG PO TABS: 875.0000 mg | ORAL_TABLET | Freq: Two times a day (BID) | ORAL | Status: DC

## 2015-07-26 NOTE — ED Provider Notes (Signed)
CSN: [id number]     Arrival date & time 07/26/15  1153 History   First MD Initiated Contact with Patient 07/26/15 1228     Chief Complaint  Patient presents with  . Sinusitis   (Consider location/radiation/quality/duration/timing/severity/associated sxs/prior Treatment) Patient is a 55 y.o. female presenting with URI. The history is provided by the patient.  URI Presenting symptoms: congestion, ear pain, facial pain, fatigue and fever   Severity:  Moderate Onset quality:  Sudden Duration:  1 week Timing:  Constant Progression:  Worsening Chronicity:  New Relieved by:  Nothing Ineffective treatments:  OTC medications Associated symptoms: headaches and sinus pain   Associated symptoms: no wheezing   Risk factors: sick contacts   Risk factors: not elderly, no chronic cardiac disease, no chronic kidney disease, no chronic respiratory disease, no diabetes mellitus, no immunosuppression, no recent illness and no recent travel     Past Medical History  Diagnosis Date  . Hypertension    Past Surgical History  Procedure Laterality Date  . Gastric bypass    . Cesarean section  1998   Family History  Problem Relation Age of Onset  . Hypertension Mother   . Diabetes Mother   . Hypertension Father   . Diabetes Father    Social History  Substance Use Topics  . Smoking status: Former Research scientist (life sciences)  . Smokeless tobacco: None  . Alcohol Use: 1.2 - 1.8 oz/week    2-3 Glasses of wine per week   OB History    No data available     Review of Systems  Constitutional: Positive for fever and fatigue.  HENT: Positive for congestion and ear pain.   Respiratory: Negative for wheezing.   Neurological: Positive for headaches.    Allergies  Review of patient's allergies indicates no known allergies.  Home Medications   Prior to Admission medications   Medication Sig Start Date End Date Taking? Authorizing Provider  amLODipine (NORVASC) 10 MG tablet Take 1 tablet (10 mg total) by mouth  daily. 04/24/15  Yes Juline Patch, MD  carvedilol (COREG) 3.125 MG tablet Take 1 tablet (3.125 mg total) by mouth 2 (two) times daily with a meal. Dr Nehemiah Massed 04/24/15  Yes Juline Patch, MD  losartan-hydrochlorothiazide (HYZAAR) 100-25 MG tablet Take 1 tablet by mouth daily. 04/24/15  Yes Juline Patch, MD  amoxicillin (AMOXIL) 875 MG tablet Take 1 tablet (875 mg total) by mouth 2 (two) times daily. 07/26/15   Norval Gable, MD  fluticasone (FLONASE) 50 MCG/ACT nasal spray Place 1 spray into both nostrils 2 (two) times daily. 01/05/15 01/26/15  Olen Cordial, NP  losartan-hydrochlorothiazide (HYZAAR) 100-25 MG tablet TAKE 1 TABLET BY MOUTH DAILY. 05/12/15   Juline Patch, MD   Meds Ordered and Administered this Visit  Medications - No data to display  BP 170/95 mmHg  Pulse 79  Temp(Src) 97.6 F (36.4 C) (Tympanic)  Resp 16  Ht 5\' 3"  (1.6 m)  Wt 172 lb (78.019 kg)  BMI 30.48 kg/m2  SpO2 97% No data found.   Physical Exam  Constitutional: She appears well-developed and well-nourished. No distress.  HENT:  Head: Normocephalic and atraumatic.  Right Ear: Tympanic membrane, external ear and ear canal normal.  Left Ear: Tympanic membrane, external ear and ear canal normal.  Nose: Mucosal edema and rhinorrhea present. No nose lacerations, sinus tenderness, nasal deformity, septal deviation or nasal septal hematoma. No epistaxis.  No foreign bodies. Right sinus exhibits maxillary sinus tenderness and frontal sinus tenderness. Left  sinus exhibits maxillary sinus tenderness and frontal sinus tenderness.  Mouth/Throat: Uvula is midline, oropharynx is clear and moist and mucous membranes are normal. No oropharyngeal exudate.  Eyes: Conjunctivae and EOM are normal. Pupils are equal, round, and reactive to light. Right eye exhibits no discharge. Left eye exhibits no discharge. No scleral icterus.  Neck: Normal range of motion. Neck supple. No thyromegaly present.  Cardiovascular: Normal rate,  regular rhythm and normal heart sounds.   Pulmonary/Chest: Effort normal and breath sounds normal. No respiratory distress. She has no wheezes. She has no rales.  Lymphadenopathy:    She has no cervical adenopathy.  Skin: She is not diaphoretic.  Nursing note and vitals reviewed.   ED Course  Procedures (including critical care time)  Labs Review Labs Reviewed - No data to display  Imaging Review No results found.   Visual Acuity Review  Right Eye Distance:   Left Eye Distance:   Bilateral Distance:    Right Eye Near:   Left Eye Near:    Bilateral Near:         MDM   1. Acute frontal sinusitis, recurrence not specified    Discharge Medication List as of 07/26/2015 12:35 PM    START taking these medications   Details  amoxicillin (AMOXIL) 875 MG tablet Take 1 tablet (875 mg total) by mouth 2 (two) times daily., Starting 07/26/2015, Until Discontinued, Normal       .1. diagnosis reviewed with patient 2. rx as per orders above; reviewed possible side effects, interactions, risks and benefits  3. Follow-up prn if symptoms worsen or don't improve    Norval Gable, MD 07/26/15 1244

## 2015-07-26 NOTE — ED Notes (Signed)
Patient complains of sinus pain and pressure with headache that started 3 days ago. Patient states that she has also been having ear fullness and fatigue. Patient states that she also has a cough.

## 2015-07-26 NOTE — Discharge Instructions (Signed)

## 2016-03-07 ENCOUNTER — Other Ambulatory Visit: Payer: Self-pay

## 2016-03-07 DIAGNOSIS — I10 Essential (primary) hypertension: Secondary | ICD-10-CM

## 2016-03-07 MED ORDER — AMLODIPINE BESYLATE 10 MG PO TABS: 10.0000 mg | ORAL_TABLET | Freq: Every day | ORAL | 0 refills | Status: DC

## 2016-03-14 ENCOUNTER — Encounter: Payer: Self-pay | Admitting: Family Medicine

## 2016-03-14 ENCOUNTER — Ambulatory Visit (INDEPENDENT_AMBULATORY_CARE_PROVIDER_SITE_OTHER): Payer: Federal, State, Local not specified - PPO | Admitting: Family Medicine

## 2016-03-14 VITALS — BP 140/90 | HR 80 | Ht 63.0 in | Wt 173.0 lb

## 2016-03-14 DIAGNOSIS — J309 Allergic rhinitis, unspecified: Secondary | ICD-10-CM

## 2016-03-14 DIAGNOSIS — I10 Essential (primary) hypertension: Secondary | ICD-10-CM | POA: Diagnosis not present

## 2016-03-14 MED ORDER — FLUTICASONE PROPIONATE 50 MCG/ACT NA SUSP: 1.0000 | Freq: Two times a day (BID) | NASAL | 11 refills | Status: DC

## 2016-03-14 MED ORDER — CARVEDILOL 3.125 MG PO TABS: 3.1250 mg | ORAL_TABLET | Freq: Two times a day (BID) | ORAL | 5 refills | Status: DC

## 2016-03-14 MED ORDER — AMLODIPINE BESYLATE 10 MG PO TABS: 10.0000 mg | ORAL_TABLET | Freq: Every day | ORAL | 5 refills | Status: DC

## 2016-03-14 MED ORDER — LOSARTAN POTASSIUM-HCTZ 100-25 MG PO TABS: 1.0000 | ORAL_TABLET | Freq: Every day | ORAL | 5 refills | Status: DC

## 2016-03-14 NOTE — Progress Notes (Signed)
Name: Alyssa Hunt   MRN: [id number]    DOB: 1960/05/11   Date:03/14/2016       Progress Note  Subjective  Chief Complaint  Chief Complaint  Patient presents with  . Hypertension    hasn't had med this morning    Hypertension  This is a chronic problem. The current episode started more than 1 year ago. The problem has been waxing and waning since onset. The problem is controlled. Pertinent negatives include no anxiety, blurred vision, chest pain, headaches, malaise/fatigue, neck pain, orthopnea, palpitations, peripheral edema, PND, shortness of breath or sweats. There are no associated agents to hypertension. Past treatments include angiotensin blockers, calcium channel blockers, beta blockers and diuretics. The current treatment provides moderate improvement. There are no compliance problems.  There is no history of angina, kidney disease, CAD/MI, CVA, heart failure, left ventricular hypertrophy, PVD or retinopathy. There is no history of chronic renal disease or a hypertension causing med.    No problem-specific Assessment & Plan notes found for this encounter.   Past Medical History:  Diagnosis Date  . Hypertension     Past Surgical History:  Procedure Laterality Date  . CESAREAN SECTION  1998  . GASTRIC BYPASS      Family History  Problem Relation Age of Onset  . Hypertension Mother   . Diabetes Mother   . Hypertension Father   . Diabetes Father     Social History   Social History  . Marital status: Married    Spouse name: N/A  . Number of children: N/A  . Years of education: N/A   Occupational History  . Not on file.   Social History Main Topics  . Smoking status: Former Research scientist (life sciences)  . Smokeless tobacco: Never Used  . Alcohol use 1.2 - 1.8 oz/week    2 - 3 Glasses of wine per week  . Drug use: No  . Sexual activity: Yes    Birth control/ protection: Post-menopausal   Other Topics Concern  . Not on file   Social History Narrative  . No narrative  on file    No Known Allergies   Review of Systems  Constitutional: Negative for chills, fever, malaise/fatigue and weight loss.  HENT: Negative for ear discharge, ear pain and sore throat.   Eyes: Negative for blurred vision.  Respiratory: Negative for cough, sputum production, shortness of breath and wheezing.   Cardiovascular: Negative for chest pain, palpitations, orthopnea, leg swelling and PND.  Gastrointestinal: Negative for abdominal pain, blood in stool, constipation, diarrhea, heartburn, melena and nausea.  Genitourinary: Negative for dysuria, frequency, hematuria and urgency.  Musculoskeletal: Negative for back pain, joint pain, myalgias and neck pain.  Skin: Negative for rash.  Neurological: Negative for dizziness, tingling, sensory change, focal weakness and headaches.  Endo/Heme/Allergies: Negative for environmental allergies and polydipsia. Does not bruise/bleed easily.  Psychiatric/Behavioral: Negative for depression and suicidal ideas. The patient is not nervous/anxious and does not have insomnia.      Objective  Vitals:   03/14/16 0915  BP: 140/90  Pulse: 80  Weight: 173 lb (78.5 kg)  Height: 5\' 3"  (1.6 m)    Physical Exam  Constitutional: She is well-developed, well-nourished, and in no distress. No distress.  HENT:  Head: Normocephalic and atraumatic.  Right Ear: Tympanic membrane, external ear and ear canal normal.  Left Ear: Tympanic membrane, external ear and ear canal normal.  Nose: Nose normal.  Mouth/Throat: Oropharynx is clear and moist.  Eyes: Conjunctivae and EOM are normal.  Pupils are equal, round, and reactive to light. Right eye exhibits no discharge. Left eye exhibits no discharge.  Neck: Normal range of motion. Neck supple. No JVD present. No thyromegaly present.  Cardiovascular: Normal rate, regular rhythm, S1 normal, S2 normal and intact distal pulses.  Exam reveals no gallop, no S3, no S4 and no friction rub.   Murmur heard.  Systolic  murmur is present with a grade of 2/6  Pulmonary/Chest: Effort normal and breath sounds normal.  Abdominal: Soft. Bowel sounds are normal. She exhibits no mass. There is no tenderness. There is no guarding.  Musculoskeletal: Normal range of motion. She exhibits no edema.  Lymphadenopathy:    She has no cervical adenopathy.  Neurological: She is alert. She has normal reflexes.  Skin: Skin is warm and dry. She is not diaphoretic.  Psychiatric: Mood and affect normal.  Nursing note and vitals reviewed.     Assessment & Plan  Problem List Items Addressed This Visit      Cardiovascular and Mediastinum   Essential hypertension   Relevant Medications   losartan-hydrochlorothiazide (HYZAAR) 100-25 MG tablet   carvedilol (COREG) 3.125 MG tablet   amLODipine (NORVASC) 10 MG tablet   Other Relevant Orders   Renal Function Panel    Other Visit Diagnoses    Chronic allergic rhinitis, unspecified seasonality, unspecified trigger    -  Primary   Relevant Medications   fluticasone (FLONASE) 50 MCG/ACT nasal spray        Dr. Collie Kittel [facility]  03/14/16

## 2016-03-15 LAB — RENAL FUNCTION PANEL
ALBUMIN: 4.2 g/dL (ref 3.5–5.5)
BUN/Creatinine Ratio: 18 (ref 9–23)
BUN: 30 mg/dL — ABNORMAL HIGH (ref 6–24)
CALCIUM: 9.5 mg/dL (ref 8.7–10.2)
CO2: 26 mmol/L (ref 18–29)
CREATININE: 1.63 mg/dL — AB (ref 0.57–1.00)
Chloride: 100 mmol/L (ref 96–106)
GFR calc Af Amer: 41 mL/min/{1.73_m2} — ABNORMAL LOW (ref >59)
GFR, EST NON AFRICAN AMERICAN: 35 mL/min/{1.73_m2} — AB (ref >59)
Glucose: 81 mg/dL (ref 65–99)
PHOSPHORUS: 3.8 mg/dL (ref 2.5–4.5)
Potassium: 4.4 mmol/L (ref 3.5–5.2)
SODIUM: 142 mmol/L (ref 134–144)

## 2016-03-17 ENCOUNTER — Other Ambulatory Visit: Payer: Self-pay | Admitting: Family Medicine

## 2016-03-17 DIAGNOSIS — I10 Essential (primary) hypertension: Secondary | ICD-10-CM

## 2016-09-12 ENCOUNTER — Ambulatory Visit: Payer: Federal, State, Local not specified - PPO | Admitting: Family Medicine

## 2016-09-22 ENCOUNTER — Ambulatory Visit (INDEPENDENT_AMBULATORY_CARE_PROVIDER_SITE_OTHER): Payer: Federal, State, Local not specified - PPO | Admitting: Family Medicine

## 2016-09-22 ENCOUNTER — Encounter: Payer: Self-pay | Admitting: Family Medicine

## 2016-09-22 VITALS — BP 120/70 | HR 80 | Ht 63.0 in | Wt 206.0 lb

## 2016-09-22 DIAGNOSIS — I10 Essential (primary) hypertension: Secondary | ICD-10-CM | POA: Diagnosis not present

## 2016-09-22 MED ORDER — LOSARTAN POTASSIUM-HCTZ 100-25 MG PO TABS: 1.0000 | ORAL_TABLET | Freq: Every day | ORAL | 5 refills | Status: DC

## 2016-09-22 MED ORDER — AMLODIPINE BESYLATE 10 MG PO TABS: 10.0000 mg | ORAL_TABLET | Freq: Every day | ORAL | 5 refills | Status: DC

## 2016-09-22 MED ORDER — CARVEDILOL 3.125 MG PO TABS
3.1250 mg | ORAL_TABLET | Freq: Two times a day (BID) | ORAL | 5 refills | Status: DC
Start: 2016-09-22 — End: 2017-12-26

## 2016-09-22 NOTE — Progress Notes (Signed)
Name: Alyssa Hunt   MRN: [id number]    DOB: [birth year]   Date:09/22/2016       Progress Note  Subjective  Chief Complaint  Chief Complaint  Patient presents with  . Hypertension    Hypertension  This is a chronic problem. The current episode started more than 1 year ago. The problem is unchanged. The problem is controlled. Pertinent negatives include no anxiety, blurred vision, chest pain, headaches, malaise/fatigue, neck pain, orthopnea, palpitations, peripheral edema, PND, shortness of breath or sweats. There are no associated agents to hypertension. There are no known risk factors for coronary artery disease. Past treatments include calcium channel blockers, diuretics and beta blockers. The current treatment provides moderate improvement. There are no compliance problems.  There is no history of angina, kidney disease, CAD/MI, CVA, heart failure, left ventricular hypertrophy, PVD or retinopathy. There is no history of chronic renal disease, hyperaldosteronism or a hypertension causing med.    No problem-specific Assessment & Plan notes found for this encounter.   Past Medical History:  Diagnosis Date  . Hypertension     Past Surgical History:  Procedure Laterality Date  . CESAREAN SECTION  1998  . GASTRIC BYPASS      Family History  Problem Relation Age of Onset  . Hypertension Mother   . Diabetes Mother   . Hypertension Father   . Diabetes Father     Social History   Social History  . Marital status: Married    Spouse name: N/A  . Number of children: N/A  . Years of education: N/A   Occupational History  . Not on file.   Social History Main Topics  . Smoking status: Former Research scientist (life sciences)  . Smokeless tobacco: Never Used  . Alcohol use 1.2 - 1.8 oz/week    2 - 3 Glasses of wine per week  . Drug use: No  . Sexual activity: Yes    Birth control/ protection: Post-menopausal   Other Topics Concern  . Not on file   Social History Narrative  . No narrative  on file    No Known Allergies  Outpatient Medications Prior to Visit  Medication Sig Dispense Refill  . amLODipine (NORVASC) 10 MG tablet Take 1 tablet (10 mg total) by mouth daily. 30 tablet 5  . carvedilol (COREG) 3.125 MG tablet Take 1 tablet (3.125 mg total) by mouth 2 (two) times daily with a meal. Dr Nehemiah Massed 60 tablet 5  . losartan-hydrochlorothiazide (HYZAAR) 100-25 MG tablet Take 1 tablet by mouth daily. 30 tablet 5  . fluticasone (FLONASE) 50 MCG/ACT nasal spray Place 1 spray into both nostrils 2 (two) times daily. 16 g 11  . carvedilol (COREG) 3.125 MG tablet TAKE 1 TABLET (3.125 MG TOTAL) BY MOUTH DAILY. 60 tablet 4   No facility-administered medications prior to visit.     Review of Systems  Constitutional: Negative for chills, fever, malaise/fatigue and weight loss.  HENT: Negative for ear discharge, ear pain and sore throat.   Eyes: Negative for blurred vision.  Respiratory: Negative for cough, sputum production, shortness of breath and wheezing.   Cardiovascular: Negative for chest pain, palpitations, orthopnea, leg swelling and PND.  Gastrointestinal: Negative for abdominal pain, blood in stool, constipation, diarrhea, heartburn, melena and nausea.  Genitourinary: Negative for dysuria, frequency, hematuria and urgency.  Musculoskeletal: Negative for back pain, joint pain, myalgias and neck pain.  Skin: Negative for rash.  Neurological: Negative for dizziness, tingling, sensory change, focal weakness and headaches.  Endo/Heme/Allergies: Negative for environmental  allergies and polydipsia. Does not bruise/bleed easily.  Psychiatric/Behavioral: Negative for depression and suicidal ideas. The patient is not nervous/anxious and does not have insomnia.      Objective  Vitals:   09/22/16 1533  BP: 120/70  Pulse: 80  Weight: 206 lb (93.4 kg)  Height: 5\' 3"  (1.6 m)    Physical Exam  Constitutional: She is well-developed, well-nourished, and in no distress. No  distress.  HENT:  Head: Normocephalic and atraumatic.  Right Ear: Tympanic membrane, external ear and ear canal normal.  Left Ear: Tympanic membrane, external ear and ear canal normal.  Nose: Nose normal.  Mouth/Throat: Oropharynx is clear and moist. No oropharyngeal exudate, posterior oropharyngeal edema or posterior oropharyngeal erythema.  Eyes: Pupils are equal, round, and reactive to light. Conjunctivae and EOM are normal. Right eye exhibits no discharge. Left eye exhibits no discharge.  Neck: Normal range of motion. Neck supple. No JVD present. No thyromegaly present.  Cardiovascular: Normal rate, regular rhythm, normal heart sounds and intact distal pulses.  Exam reveals no gallop and no friction rub.   No murmur heard. Pulmonary/Chest: Effort normal and breath sounds normal. She has no wheezes. She has no rales.  Abdominal: Soft. Bowel sounds are normal. She exhibits no mass. There is no tenderness. There is no guarding.  Musculoskeletal: Normal range of motion. She exhibits no edema.  Lymphadenopathy:    She has no cervical adenopathy.  Neurological: She is alert. She has normal reflexes.  Skin: Skin is warm and dry. She is not diaphoretic.  Psychiatric: Mood and affect normal.  Nursing note and vitals reviewed.     Assessment & Plan  Problem List Items Addressed This Visit      Cardiovascular and Mediastinum   Essential hypertension - Primary   Relevant Medications   carvedilol (COREG) 3.125 MG tablet   losartan-hydrochlorothiazide (HYZAAR) 100-25 MG tablet   amLODipine (NORVASC) 10 MG tablet   Other Relevant Orders   Renal Function Panel      Meds ordered this encounter  Medications  . carvedilol (COREG) 3.125 MG tablet    Sig: Take 1 tablet (3.125 mg total) by mouth 2 (two) times daily with a meal. Dr Nehemiah Massed    Dispense:  60 tablet    Refill:  5  . losartan-hydrochlorothiazide (HYZAAR) 100-25 MG tablet    Sig: Take 1 tablet by mouth daily.    Dispense:  30  tablet    Refill:  5    Needs appt- not seen in almost a year  . amLODipine (NORVASC) 10 MG tablet    Sig: Take 1 tablet (10 mg total) by mouth daily.    Dispense:  30 tablet    Refill:  5    Was told by pt- a [facility] out of town will be requesting this RX- only getting # 15 due to not being seen since March of last year      Dr. [facility]  09/22/16

## 2016-09-23 LAB — RENAL FUNCTION PANEL
Albumin: 4.2 g/dL (ref 3.5–5.5)
BUN / CREAT RATIO: 26 — AB (ref 9–23)
BUN: 36 mg/dL — ABNORMAL HIGH (ref 6–24)
CALCIUM: 9.2 mg/dL (ref 8.7–10.2)
CO2: 23 mmol/L (ref 20–29)
Chloride: 104 mmol/L (ref 96–106)
Creatinine, Ser: 1.38 mg/dL — ABNORMAL HIGH (ref 0.57–1.00)
GFR calc Af Amer: 49 mL/min/{1.73_m2} — ABNORMAL LOW (ref >59)
GFR, EST NON AFRICAN AMERICAN: 43 mL/min/{1.73_m2} — AB (ref >59)
GLUCOSE: 87 mg/dL (ref 65–99)
PHOSPHORUS: 3.8 mg/dL (ref 2.5–4.5)
POTASSIUM: 4.3 mmol/L (ref 3.5–5.2)
SODIUM: 143 mmol/L (ref 134–144)

## 2017-10-18 ENCOUNTER — Other Ambulatory Visit: Payer: Self-pay | Admitting: Family Medicine

## 2017-10-18 DIAGNOSIS — I10 Essential (primary) hypertension: Secondary | ICD-10-CM

## 2017-10-25 ENCOUNTER — Other Ambulatory Visit: Payer: Self-pay | Admitting: Family Medicine

## 2017-10-25 DIAGNOSIS — I10 Essential (primary) hypertension: Secondary | ICD-10-CM

## 2017-12-11 ENCOUNTER — Other Ambulatory Visit: Payer: Self-pay | Admitting: Family Medicine

## 2017-12-11 DIAGNOSIS — I10 Essential (primary) hypertension: Secondary | ICD-10-CM

## 2017-12-26 ENCOUNTER — Encounter: Payer: Self-pay | Admitting: Family Medicine

## 2017-12-26 ENCOUNTER — Ambulatory Visit: Payer: Federal, State, Local not specified - PPO | Admitting: Family Medicine

## 2017-12-26 VITALS — BP 166/120 | HR 72 | Ht 63.0 in | Wt 213.0 lb

## 2017-12-26 DIAGNOSIS — I10 Essential (primary) hypertension: Secondary | ICD-10-CM

## 2017-12-26 DIAGNOSIS — Z23 Encounter for immunization: Secondary | ICD-10-CM

## 2017-12-26 MED ORDER — AMLODIPINE BESYLATE 10 MG PO TABS: 10.0000 mg | ORAL_TABLET | Freq: Every day | ORAL | 1 refills | Status: DC

## 2017-12-26 MED ORDER — CARVEDILOL 3.125 MG PO TABS: 3.1250 mg | ORAL_TABLET | Freq: Two times a day (BID) | ORAL | 1 refills | Status: DC

## 2017-12-26 MED ORDER — LOSARTAN POTASSIUM 100 MG PO TABS: ORAL_TABLET | ORAL | 1 refills | Status: DC

## 2017-12-26 MED ORDER — HYDROCHLOROTHIAZIDE 25 MG PO TABS: ORAL_TABLET | ORAL | 1 refills | Status: DC

## 2017-12-26 NOTE — Progress Notes (Signed)
Date:  12/26/2017   Name:  Alyssa Hunt   DOB:  04-17-1960   MRN:  [id number]   Chief Complaint: Hypertension and Flu Vaccine Hypertension  This is a new problem. The current episode started more than 1 year ago. The problem has been gradually improving since onset. The problem is uncontrolled. Associated symptoms include malaise/fatigue. Pertinent negatives include no anxiety, blurred vision, chest pain, headaches, neck pain, orthopnea, palpitations, peripheral edema, PND, shortness of breath or sweats. (Sluggish) There are no associated agents to hypertension. There are no known risk factors for coronary artery disease. Past treatments include beta blockers, calcium channel blockers, diuretics and angiotensin blockers. The current treatment provides moderate improvement. There is no history of angina, kidney disease, CAD/MI, CVA, heart failure, left ventricular hypertrophy, PVD or retinopathy. There is no history of chronic renal disease, a hypertension causing med or renovascular disease.     Review of Systems  Constitutional: Positive for malaise/fatigue. Negative for chills, fatigue, fever and unexpected weight change.  HENT: Negative for congestion, ear discharge, ear pain, rhinorrhea, sinus pressure, sneezing and sore throat.   Eyes: Negative for blurred vision, photophobia, pain, discharge, redness and itching.  Respiratory: Negative for cough, shortness of breath, wheezing and stridor.   Cardiovascular: Negative for chest pain, palpitations, orthopnea and PND.  Gastrointestinal: Negative for abdominal pain, blood in stool, constipation, diarrhea, nausea and vomiting.  Endocrine: Negative for cold intolerance, heat intolerance, polydipsia, polyphagia and polyuria.  Genitourinary: Negative for dysuria, flank pain, frequency, hematuria, menstrual problem, pelvic pain, urgency, vaginal bleeding and vaginal discharge.  Musculoskeletal: Negative for arthralgias, back pain,  myalgias and neck pain.  Skin: Negative for rash.  Allergic/Immunologic: Negative for environmental allergies and food allergies.  Neurological: Negative for dizziness, weakness, light-headedness, numbness and headaches.  Hematological: Negative for adenopathy. Does not bruise/bleed easily.  Psychiatric/Behavioral: Negative for dysphoric mood. The patient is not nervous/anxious.     Patient Active Problem List   Diagnosis Date Noted  . Essential hypertension 03/10/2015    No Known Allergies  Past Surgical History:  Procedure Laterality Date  . CESAREAN SECTION  1998  . GASTRIC BYPASS      Social History   Tobacco Use  . Smoking status: Former Research scientist (life sciences)  . Smokeless tobacco: Never Used  Substance Use Topics  . Alcohol use: Yes    Alcohol/week: 2.0 - 3.0 standard drinks    Types: 2 - 3 Glasses of wine per week  . Drug use: No     Medication list has been reviewed and updated.  Current Meds  Medication Sig  . amLODipine (NORVASC) 10 MG tablet TAKE 1 TABLET BY MOUTH EVERY DAY  . carvedilol (COREG) 3.125 MG tablet Take 1 tablet (3.125 mg total) by mouth 2 (two) times daily with a meal. Dr Nehemiah Massed  . hydrochlorothiazide (HYDRODIURIL) 25 MG tablet TAKE 1 TABLET BY MOUTH DAILY ALONG WITH LOSARTAN DOSE  . losartan (COZAAR) 100 MG tablet TAKE 1 TABLET BY MOUTH EVERY DAY ALONG WITH HYDROCHLOROTHIAZIDE DOSE    PHQ 2/9 Scores 12/26/2017 09/22/2016 04/24/2015 03/10/2015  PHQ - 2 Score 0 0 0 0  PHQ- 9 Score 0 0 - -    Physical Exam  Constitutional: She is oriented to person, place, and time. She appears well-developed and well-nourished.  HENT:  Head: Normocephalic.  Right Ear: External ear normal.  Left Ear: External ear normal.  Mouth/Throat: Oropharynx is clear and moist.  Eyes: Pupils are equal, round, and reactive to light. Conjunctivae and EOM  are normal. Lids are everted and swept, no foreign bodies found. Left eye exhibits no hordeolum. No foreign body present in the left  eye. Right conjunctiva is not injected. Left conjunctiva is not injected. No scleral icterus.  Neck: Normal range of motion. Neck supple. No JVD present. No tracheal deviation present. No thyromegaly present.  Cardiovascular: Normal rate, regular rhythm, normal heart sounds and intact distal pulses. Exam reveals no gallop and no friction rub.  No murmur heard. Pulmonary/Chest: Effort normal and breath sounds normal. No respiratory distress. She has no wheezes. She has no rales.  Abdominal: Soft. Bowel sounds are normal. She exhibits no mass. There is no hepatosplenomegaly. There is no tenderness. There is no rebound and no guarding.  Musculoskeletal: Normal range of motion. She exhibits no edema or tenderness.  Lymphadenopathy:    She has no cervical adenopathy.  Neurological: She is alert and oriented to person, place, and time. She has normal strength. She displays normal reflexes. No cranial nerve deficit.  Skin: Skin is warm. No rash noted.  Psychiatric: She has a normal mood and affect. Her mood appears not anxious. She does not exhibit a depressed mood.  Nursing note and vitals reviewed.   BP (!) 166/120   Pulse 72   Ht 5\' 3"  (1.6 m)   Wt 213 lb (96.6 kg)   BMI 37.73 kg/m   Assessment and Plan:  1. Essential hypertension Chronic.  Uncontrolled.  Due to noncompliance not on medication will refill carvedilol losartan hydrochlorothiazide and amlodipine at present dose and will recheck in 6 weeks on medication. - carvedilol (COREG) 3.125 MG tablet; Take 1 tablet (3.125 mg total) by mouth 2 (two) times daily with a meal. Dr Nehemiah Massed  Dispense: 60 tablet; Refill: 1 - losartan (COZAAR) 100 MG tablet; TAKE 1 TABLET BY MOUTH EVERY DAY ALONG WITH HYDROCHLOROTHIAZIDE DOSE  Dispense: 30 tablet; Refill: 1 - amLODipine (NORVASC) 10 MG tablet; Take 1 tablet (10 mg total) by mouth daily.  Dispense: 30 tablet; Refill: 1 - hydrochlorothiazide (HYDRODIURIL) 25 MG tablet; TAKE 1 TABLET BY MOUTH DAILY  ALONG WITH LOSARTAN DOSE  Dispense: 30 tablet; Refill: 1  2. Flu vaccine need Discussed and administered - Flu Vaccine QUAD 6+ mos PF IM (Fluarix Quad PF)   Dr. Otilio Miu [facility]  12/26/2017

## 2018-02-02 DIAGNOSIS — K080 Exfoliation of teeth due to systemic causes: Secondary | ICD-10-CM | POA: Diagnosis not present

## 2018-02-05 ENCOUNTER — Other Ambulatory Visit (HOSPITAL_COMMUNITY)
Admission: RE | Admit: 2018-02-05 | Discharge: 2018-02-05 | Disposition: A | Discharge status: Home / Self Care | Payer: Federal, State, Local not specified - PPO | Source: Ambulatory Visit | Attending: Family Medicine | Admitting: Family Medicine

## 2018-02-05 ENCOUNTER — Encounter: Payer: Self-pay | Admitting: Family Medicine

## 2018-02-05 ENCOUNTER — Other Ambulatory Visit: Payer: Self-pay

## 2018-02-05 ENCOUNTER — Ambulatory Visit: Payer: Federal, State, Local not specified - PPO | Admitting: Family Medicine

## 2018-02-05 ENCOUNTER — Ambulatory Visit (INDEPENDENT_AMBULATORY_CARE_PROVIDER_SITE_OTHER): Payer: Federal, State, Local not specified - PPO | Admitting: Family Medicine

## 2018-02-05 VITALS — BP 148/98 | HR 72 | Ht 63.0 in | Wt 207.0 lb

## 2018-02-05 DIAGNOSIS — Z124 Encounter for screening for malignant neoplasm of cervix: Secondary | ICD-10-CM

## 2018-02-05 DIAGNOSIS — Z0000 Encounter for general adult medical examination without abnormal findings: Secondary | ICD-10-CM | POA: Insufficient documentation

## 2018-02-05 DIAGNOSIS — E6609 Other obesity due to excess calories: Secondary | ICD-10-CM | POA: Diagnosis not present

## 2018-02-05 DIAGNOSIS — Z1211 Encounter for screening for malignant neoplasm of colon: Secondary | ICD-10-CM

## 2018-02-05 DIAGNOSIS — I10 Essential (primary) hypertension: Secondary | ICD-10-CM | POA: Diagnosis not present

## 2018-02-05 DIAGNOSIS — Z01419 Encounter for gynecological examination (general) (routine) without abnormal findings: Secondary | ICD-10-CM

## 2018-02-05 DIAGNOSIS — Z23 Encounter for immunization: Secondary | ICD-10-CM

## 2018-02-05 DIAGNOSIS — Z6836 Body mass index (BMI) 36.0-36.9, adult: Secondary | ICD-10-CM

## 2018-02-05 DIAGNOSIS — Z1231 Encounter for screening mammogram for malignant neoplasm of breast: Secondary | ICD-10-CM

## 2018-02-05 LAB — HEMOCCULT GUIAC POC 1CARD (OFFICE): FECAL OCCULT BLD: NEGATIVE

## 2018-02-05 MED ORDER — LOSARTAN POTASSIUM 100 MG PO TABS: ORAL_TABLET | ORAL | 5 refills | Status: DC

## 2018-02-05 MED ORDER — AMLODIPINE BESYLATE 10 MG PO TABS: 10.0000 mg | ORAL_TABLET | Freq: Every day | ORAL | 5 refills | Status: DC

## 2018-02-05 MED ORDER — CARVEDILOL 3.125 MG PO TABS: 3.1250 mg | ORAL_TABLET | Freq: Two times a day (BID) | ORAL | 5 refills | Status: DC

## 2018-02-05 MED ORDER — HYDROCHLOROTHIAZIDE 25 MG PO TABS: ORAL_TABLET | ORAL | 5 refills | Status: DC

## 2018-02-05 NOTE — Patient Instructions (Signed)

## 2018-02-05 NOTE — Progress Notes (Signed)
Date:  02/05/2018   Name:  Alyssa Hunt   DOB:  1960/06/09   MRN:  [id number]   Chief Complaint: Annual Exam (mammo and pap, needs colonoscopy) and tdap need  Patient is a 57 year old female who presents for a comprehensive physical exam. The patient reports the following problems: none. Health maintenance has been reviewed up to date.   Review of Systems  Constitutional: Negative.  Negative for chills, fatigue, fever and unexpected weight change.  HENT: Negative for congestion, ear discharge, ear pain, rhinorrhea, sinus pressure, sneezing and sore throat.   Eyes: Negative for photophobia, pain, discharge, redness and itching.  Respiratory: Negative for cough, shortness of breath, wheezing and stridor.   Gastrointestinal: Negative for abdominal pain, blood in stool, constipation, diarrhea, nausea and vomiting.  Endocrine: Negative for cold intolerance, heat intolerance, polydipsia, polyphagia and polyuria.  Genitourinary: Negative for dysuria, flank pain, frequency, hematuria, menstrual problem, pelvic pain, urgency, vaginal bleeding and vaginal discharge.  Musculoskeletal: Negative for arthralgias, back pain and myalgias.  Skin: Negative for rash.  Allergic/Immunologic: Negative for environmental allergies and food allergies.  Neurological: Negative for dizziness, weakness, light-headedness, numbness and headaches.  Hematological: Negative for adenopathy. Does not bruise/bleed easily.  Psychiatric/Behavioral: Negative for dysphoric mood. The patient is not nervous/anxious.     Patient Active Problem List   Diagnosis Date Noted  . Essential hypertension 03/10/2015    No Known Allergies  Past Surgical History:  Procedure Laterality Date  . CESAREAN SECTION  1998  . GASTRIC BYPASS      Social History   Tobacco Use  . Smoking status: Former Research scientist (life sciences)  . Smokeless tobacco: Never Used  Substance Use Topics  . Alcohol use: Yes    Alcohol/week: 2.0 - 3.0 standard  drinks    Types: 2 - 3 Glasses of wine per week  . Drug use: No     Medication list has been reviewed and updated.  Current Meds  Medication Sig  . amLODipine (NORVASC) 10 MG tablet Take 1 tablet (10 mg total) by mouth daily.  . carvedilol (COREG) 3.125 MG tablet Take 1 tablet (3.125 mg total) by mouth 2 (two) times daily with a meal. Dr Nehemiah Massed  . hydrochlorothiazide (HYDRODIURIL) 25 MG tablet TAKE 1 TABLET BY MOUTH DAILY ALONG WITH LOSARTAN DOSE  . losartan (COZAAR) 100 MG tablet TAKE 1 TABLET BY MOUTH EVERY DAY ALONG WITH HYDROCHLOROTHIAZIDE DOSE    PHQ 2/9 Scores 02/05/2018 12/26/2017 09/22/2016 04/24/2015  PHQ - 2 Score 0 0 0 0  PHQ- 9 Score 0 0 0 -    Physical Exam Vitals signs and nursing note reviewed.  Constitutional:      General: She is not in acute distress.    Appearance: She is not diaphoretic.  HENT:     Head: Normocephalic and atraumatic.     Right Ear: External ear normal.     Left Ear: External ear normal.     Nose: Nose normal.  Eyes:     General: Lids are normal.        Right eye: No discharge.        Left eye: No discharge.     Extraocular Movements: Extraocular movements intact.     Conjunctiva/sclera: Conjunctivae normal.     Pupils: Pupils are equal, round, and reactive to light.  Neck:     Musculoskeletal: Normal range of motion and neck supple.     Thyroid: No thyroid mass, thyromegaly or thyroid tenderness.  Vascular: Normal carotid pulses. No carotid bruit, hepatojugular reflux or JVD.     Trachea: Trachea normal.  Cardiovascular:     Rate and Rhythm: Normal rate and regular rhythm.     Chest Wall: PMI is not displaced. No thrill.     Pulses: No decreased pulses.          Carotid pulses are 2+ on the right side and 2+ on the left side.      Radial pulses are 2+ on the right side and 2+ on the left side.       Femoral pulses are 2+ on the right side and 2+ on the left side.      Popliteal pulses are 2+ on the right side and 2+ on the left  side.       Dorsalis pedis pulses are 2+ on the right side and 2+ on the left side.       Posterior tibial pulses are 2+ on the right side and 2+ on the left side.     Heart sounds: Normal heart sounds, S1 normal and S2 normal. No murmur. No systolic murmur. No diastolic murmur. No friction rub. No gallop. No S3 or S4 sounds.   Pulmonary:     Effort: Pulmonary effort is normal.     Breath sounds: Normal breath sounds. No decreased air movement. No decreased breath sounds, wheezing, rhonchi or rales.  Chest:     Chest wall: No mass.     Breasts:        Right: Normal. No swelling, bleeding, inverted nipple, mass, nipple discharge, skin change or tenderness.        Left: Normal. No swelling, bleeding, inverted nipple, mass, nipple discharge, skin change or tenderness.  Abdominal:     General: Bowel sounds are normal.     Palpations: Abdomen is soft. There is no hepatomegaly, splenomegaly or mass.     Tenderness: There is no abdominal tenderness. There is no right CVA tenderness or guarding.  Genitourinary:    Labia:        Right: No tenderness or lesion.        Left: No tenderness or lesion.      Urethra: No prolapse or urethral swelling.  Musculoskeletal: Normal range of motion.     Lumbar back: Normal.     Right lower leg: No edema.     Left lower leg: No edema.  Lymphadenopathy:     Head:     Right side of head: No submandibular adenopathy.     Left side of head: No submandibular adenopathy.     Cervical: No cervical adenopathy.     Right cervical: No superficial cervical adenopathy.    Left cervical: No superficial cervical adenopathy.  Skin:    General: Skin is warm and dry.     Capillary Refill: Capillary refill takes less than 2 seconds.  Neurological:     Mental Status: She is alert.     Cranial Nerves: Cranial nerves are intact.     Sensory: Sensation is intact.     Motor: Motor function is intact.     Deep Tendon Reflexes: Reflexes are normal and symmetric.     Reflex  Scores:      Tricep reflexes are 2+ on the right side and 2+ on the left side.      Bicep reflexes are 2+ on the right side and 2+ on the left side.      Brachioradialis reflexes are 2+ on the  right side and 2+ on the left side.      Patellar reflexes are 2+ on the right side and 2+ on the left side.      Achilles reflexes are 2+ on the right side and 2+ on the left side.    BP (!) 148/98   Pulse 72   Ht 5\' 3"  (1.6 m)   Wt 207 lb (93.9 kg)   BMI 36.67 kg/m   Assessment and Plan: 1. Annual physical exam No subjective/objective concerns noted in the history nor physical of the exam.  Renal function lipid panel obtained. - Renal Function Panel - Lipid panel - Cytology - PAP  2. Periodic health assessment, Pap and pelvic Patient had Pap and pelvic performed on intact on exam.  3. Essential hypertension Chronic.  Controlled.  Continue amlodipine 10 mg once a day.  Carvedilol 3.125 twice daily.  Hydrochlorothiazide 25 mg 1 a day.  Will check renal panel concerning creatinine function.  Continue losartan 100 mg daily. - amLODipine (NORVASC) 10 MG tablet; Take 1 tablet (10 mg total) by mouth daily.  Dispense: 30 tablet; Refill: 5 - carvedilol (COREG) 3.125 MG tablet; Take 1 tablet (twice a day and mg total) by mouth 2 (two) times daily with a meal. Dr Nehemiah Massed  Dispense: 60 tablet; Refill: 5 - losartan (COZAAR) 100 MG tablet; TAKE 1 TABLET BY MOUTH EVERY DAY ALONG WITH HYDROCHLOROTHIAZIDE DOSE  Dispense: 30 tablet; Refill: 5 - hydrochlorothiazide (HYDRODIURIL) 25 MG tablet; TAKE 1 TABLET BY MOUTH DAILY ALONG WITH LOSARTAN DOSE  Dispense: 30 tablet; Refill: 5 - Renal Function Panel  4. Class 2 obesity due to excess calories without serious comorbidity with body mass index (BMI) of 36.0 to 36.9 in adult Health risks of being over weight were discussed and patient was counseled on weight loss options and exercise.  5. Need for diphtheria-tetanus-pertussis (Tdap) vaccine Discussed and  administered.  Exam done during exam no maladies noted with palpation - Tdap vaccine greater than or equal to 7yo IM  6. Breast cancer screening by mammogram Exam noted no abnormalities to palpation of the breast.  Pain 3D milligrams in the near future. - MM 3D SCREEN BREAST BILATERAL; Future  7. Colon cancer screening Discussed.  Colon cancer screening with gastroenterology obtained.  Echo was noted to be negative. - POCT Occult Blood Stool - Ambulatory referral to Gastroenterology  8. Cervical cancer screening Cervical cancer screening with Pap smear discussed with patient.  Pap attained with bimanual pelvic exam. - Cytology - PAP

## 2018-02-06 LAB — LIPID PANEL
CHOL/HDL RATIO: 2.8 ratio (ref 0.0–4.4)
Cholesterol, Total: 178 mg/dL (ref 100–199)
HDL: 64 mg/dL (ref >39)
LDL Calculated: 100 mg/dL — ABNORMAL HIGH (ref 0–99)
TRIGLYCERIDES: 69 mg/dL (ref 0–149)
VLDL CHOLESTEROL CAL: 14 mg/dL (ref 5–40)

## 2018-02-06 LAB — RENAL FUNCTION PANEL
ALBUMIN: 3.9 g/dL (ref 3.5–5.5)
BUN/Creatinine Ratio: 19 (ref 9–23)
BUN: 32 mg/dL — ABNORMAL HIGH (ref 6–24)
CO2: 26 mmol/L (ref 20–29)
Calcium: 9.6 mg/dL (ref 8.7–10.2)
Chloride: 101 mmol/L (ref 96–106)
Creatinine, Ser: 1.71 mg/dL — ABNORMAL HIGH (ref 0.57–1.00)
GFR, EST AFRICAN AMERICAN: 38 mL/min/{1.73_m2} — AB (ref >59)
GFR, EST NON AFRICAN AMERICAN: 33 mL/min/{1.73_m2} — AB (ref >59)
Glucose: 92 mg/dL (ref 65–99)
Phosphorus: 3.8 mg/dL (ref 2.5–4.5)
Potassium: 4 mmol/L (ref 3.5–5.2)
Sodium: 142 mmol/L (ref 134–144)

## 2018-02-13 LAB — CYTOLOGY - PAP
Diagnosis: NEGATIVE
HPV (WINDOPATH): DETECTED — AB

## 2018-02-16 ENCOUNTER — Other Ambulatory Visit: Payer: Self-pay

## 2018-02-16 DIAGNOSIS — R8789 Other abnormal findings in specimens from female genital organs: Secondary | ICD-10-CM

## 2018-02-16 DIAGNOSIS — R87618 Other abnormal cytological findings on specimens from cervix uteri: Secondary | ICD-10-CM

## 2018-02-16 NOTE — Progress Notes (Unsigned)
Send to GYN

## 2018-02-20 DIAGNOSIS — N898 Other specified noninflammatory disorders of vagina: Secondary | ICD-10-CM | POA: Diagnosis not present

## 2018-02-20 DIAGNOSIS — B977 Papillomavirus as the cause of diseases classified elsewhere: Secondary | ICD-10-CM | POA: Diagnosis not present

## 2018-02-27 ENCOUNTER — Ambulatory Visit: Payer: Federal, State, Local not specified - PPO

## 2018-03-13 ENCOUNTER — Ambulatory Visit
Admission: RE | Admit: 2018-03-13 | Discharge: 2018-03-13 | Disposition: A | Discharge status: Home / Self Care | Payer: Federal, State, Local not specified - PPO | Source: Ambulatory Visit | Attending: Family Medicine | Admitting: Family Medicine

## 2018-03-13 ENCOUNTER — Encounter: Payer: Self-pay | Admitting: *Deleted

## 2018-03-13 ENCOUNTER — Other Ambulatory Visit: Payer: Self-pay

## 2018-03-13 DIAGNOSIS — Z1231 Encounter for screening mammogram for malignant neoplasm of breast: Secondary | ICD-10-CM | POA: Diagnosis not present

## 2018-03-15 NOTE — Discharge Instructions (Signed)
General Anesthesia, Adult, Care After  This sheet gives you information about how to care for yourself after your procedure. Your health care provider may also give you more specific instructions. If you have problems or questions, contact your health care provider.  What can I expect after the procedure?  After the procedure, the following side effects are common:  Pain or discomfort at the IV site.  Nausea.  Vomiting.  Sore throat.  Trouble concentrating.  Feeling cold or chills.  Weak or tired.  Sleepiness and fatigue.  Soreness and body aches. These side effects can affect parts of the body that were not involved in surgery.  Follow these instructions at home:    For at least 24 hours after the procedure:  Have a responsible adult stay with you. It is important to have someone help care for you until you are awake and alert.  Rest as needed.  Do not:  Participate in activities in which you could fall or become injured.  Drive.  Use heavy machinery.  Drink alcohol.  Take sleeping pills or medicines that cause drowsiness.  Make important decisions or sign legal documents.  Take care of children on your own.  Eating and drinking  Follow any instructions from your health care provider about eating or drinking restrictions.  When you feel hungry, start by eating small amounts of foods that are soft and easy to digest (bland), such as toast. Gradually return to your regular diet.  Drink enough fluid to keep your urine pale yellow.  If you vomit, rehydrate by drinking water, juice, or clear broth.  General instructions  If you have sleep apnea, surgery and certain medicines can increase your risk for breathing problems. Follow instructions from your health care provider about wearing your sleep device:  Anytime you are sleeping, including during daytime naps.  While taking prescription pain medicines, sleeping medicines, or medicines that make you drowsy.  Return to your normal activities as told by your health care  provider. Ask your health care provider what activities are safe for you.  Take over-the-counter and prescription medicines only as told by your health care provider.  If you smoke, do not smoke without supervision.  Keep all follow-up visits as told by your health care provider. This is important.  Contact a health care provider if:  You have nausea or vomiting that does not get better with medicine.  You cannot eat or drink without vomiting.  You have pain that does not get better with medicine.  You are unable to pass urine.  You develop a skin rash.  You have a fever.  You have redness around your IV site that gets worse.  Get help right away if:  You have difficulty breathing.  You have chest pain.  You have blood in your urine or stool, or you vomit blood.  Summary  After the procedure, it is common to have a sore throat or nausea. It is also common to feel tired.  Have a responsible adult stay with you for the first 24 hours after general anesthesia. It is important to have someone help care for you until you are awake and alert.  When you feel hungry, start by eating small amounts of foods that are soft and easy to digest (bland), such as toast. Gradually return to your regular diet.  Drink enough fluid to keep your urine pale yellow.  Return to your normal activities as told by your health care provider. Ask your health care   provider what activities are safe for you.  This information is not intended to replace advice given to you by your health care provider. Make sure you discuss any questions you have with your health care provider.  Document Released: 05/09/2000 Document Revised: 09/16/2016 Document Reviewed: 09/16/2016  Elsevier Interactive Patient Education  2019 [employer]

## 2018-03-16 ENCOUNTER — Ambulatory Visit: Payer: Federal, State, Local not specified - PPO | Admitting: Anesthesiology

## 2018-03-16 ENCOUNTER — Encounter
Admission: RE | Disposition: A | Discharge status: Home / Self Care | Payer: Self-pay | Source: Home / Self Care | Attending: Gastroenterology

## 2018-03-16 ENCOUNTER — Ambulatory Visit
Admission: RE | Admit: 2018-03-16 | Discharge: 2018-03-16 | Disposition: A | Discharge status: Home / Self Care | Payer: Federal, State, Local not specified - PPO | Attending: Gastroenterology | Admitting: Gastroenterology

## 2018-03-16 DIAGNOSIS — D123 Benign neoplasm of transverse colon: Secondary | ICD-10-CM | POA: Diagnosis not present

## 2018-03-16 DIAGNOSIS — Z79899 Other long term (current) drug therapy: Secondary | ICD-10-CM | POA: Diagnosis not present

## 2018-03-16 DIAGNOSIS — K5730 Diverticulosis of large intestine without perforation or abscess without bleeding: Secondary | ICD-10-CM | POA: Insufficient documentation

## 2018-03-16 DIAGNOSIS — Z9884 Bariatric surgery status: Secondary | ICD-10-CM | POA: Insufficient documentation

## 2018-03-16 DIAGNOSIS — K640 First degree hemorrhoids: Secondary | ICD-10-CM | POA: Insufficient documentation

## 2018-03-16 DIAGNOSIS — D120 Benign neoplasm of cecum: Secondary | ICD-10-CM

## 2018-03-16 DIAGNOSIS — Z87891 Personal history of nicotine dependence: Secondary | ICD-10-CM | POA: Insufficient documentation

## 2018-03-16 DIAGNOSIS — I10 Essential (primary) hypertension: Secondary | ICD-10-CM | POA: Diagnosis not present

## 2018-03-16 DIAGNOSIS — D122 Benign neoplasm of ascending colon: Secondary | ICD-10-CM

## 2018-03-16 DIAGNOSIS — Z1211 Encounter for screening for malignant neoplasm of colon: Secondary | ICD-10-CM | POA: Diagnosis not present

## 2018-03-16 HISTORY — PX: COLONOSCOPY WITH PROPOFOL: SHX5780

## 2018-03-16 HISTORY — PX: POLYPECTOMY: SHX5525

## 2018-03-16 SURGERY — COLONOSCOPY WITH PROPOFOL
Anesthesia: General | Site: Rectum

## 2018-03-16 MED ORDER — OXYCODONE HCL 5 MG PO TABS: 5.0000 mg | ORAL_TABLET | Freq: Once | ORAL | Status: DC | PRN

## 2018-03-16 MED ORDER — LIDOCAINE HCL (CARDIAC) PF 100 MG/5ML IV SOSY
PREFILLED_SYRINGE | INTRAVENOUS | Status: DC | PRN
  Administered 2018-03-16: 50 mg via INTRAVENOUS

## 2018-03-16 MED ORDER — OXYCODONE HCL 5 MG/5ML PO SOLN: 5.0000 mg | Freq: Once | ORAL | Status: DC | PRN

## 2018-03-16 MED ORDER — STERILE WATER FOR IRRIGATION IR SOLN
Status: DC | PRN
  Administered 2018-03-16: 08:00:00

## 2018-03-16 MED ORDER — PROPOFOL 10 MG/ML IV BOLUS
INTRAVENOUS | Status: DC | PRN
  Administered 2018-03-16 (×2): 100 mg via INTRAVENOUS

## 2018-03-16 SURGICAL SUPPLY — 7 items
CANISTER SUCT 1200ML W/VALVE (MISCELLANEOUS) ×2 IMPLANT
FORCEPS BIOP RAD 4 LRG CAP 4 (CUTTING FORCEPS) ×2 IMPLANT
GOWN CVR UNV OPN BCK APRN NK (MISCELLANEOUS) ×2 IMPLANT
GOWN ISOL THUMB LOOP REG UNIV (MISCELLANEOUS) ×2
KIT ENDO PROCEDURE OLY (KITS) ×2 IMPLANT
TRAP ETRAP POLY (MISCELLANEOUS) ×2 IMPLANT
WATER STERILE IRR 250ML POUR (IV SOLUTION) ×2 IMPLANT

## 2018-03-16 NOTE — Anesthesia Procedure Notes (Signed)
Procedure Name: General with mask airway Performed by: Manasi Dishon M, CRNA Pre-anesthesia Checklist: Patient identified, Emergency Drugs available, Suction available, Patient being monitored and Timeout performed Patient Re-evaluated:Patient Re-evaluated prior to induction Oxygen Delivery Method: Nasal cannula       

## 2018-03-16 NOTE — Transfer of Care (Signed)
Immediate Anesthesia Transfer of Care Note  Patient: Alyssa Hunt  Procedure(s) Performed: COLONOSCOPY WITH BIOPSIES (N/A Rectum) POLYPECTOMY (N/A Rectum)  Patient Location: PACU  Anesthesia Type: General  Level of Consciousness: awake, alert  and patient cooperative  Airway and Oxygen Therapy: Patient Spontanous Breathing and Patient connected to supplemental oxygen  Post-op Assessment: Post-op Vital signs reviewed, Patient's Cardiovascular Status Stable, Respiratory Function Stable, Patent Airway and No signs of Nausea or vomiting  Post-op Vital Signs: Reviewed and stable  Complications: No apparent anesthesia complications

## 2018-03-16 NOTE — Anesthesia Preprocedure Evaluation (Addendum)
Anesthesia Evaluation  Patient identified by MRN, date of birth, ID band Patient awake    Airway Mallampati: I  TM Distance: >3 FB Neck ROM: full    Dental no notable dental hx.    Pulmonary neg pulmonary ROS, former smoker,    Pulmonary exam normal breath sounds clear to auscultation       Cardiovascular hypertension, On Medications Normal cardiovascular exam Rhythm:regular Rate:Normal     Neuro/Psych    GI/Hepatic negative GI ROS, Neg liver ROS,   Endo/Other  negative endocrine ROS  Renal/GU negative Renal ROS     Musculoskeletal   Abdominal   Peds  Hematology negative hematology ROS (+)   Anesthesia Other Findings   Reproductive/Obstetrics negative OB ROS                            Anesthesia Physical Anesthesia Plan  ASA: II  Anesthesia Plan: General   Post-op Pain Management:    Induction:   PONV Risk Score and Plan:   Airway Management Planned:   Additional Equipment:   Intra-op Plan:   Post-operative Plan:   Informed Consent: I have reviewed the patients History and Physical, chart, labs and discussed the procedure including the risks, benefits and alternatives for the proposed anesthesia with the patient or authorized representative who has indicated his/her understanding and acceptance.       Plan Discussed with:   Anesthesia Plan Comments:         Anesthesia Quick Evaluation

## 2018-03-16 NOTE — Anesthesia Postprocedure Evaluation (Signed)
Anesthesia Post Note  Patient: Alyssa Hunt  Procedure(s) Performed: COLONOSCOPY WITH BIOPSIES (N/A Rectum) POLYPECTOMY (N/A Rectum)  Patient location during evaluation: PACU Anesthesia Type: General Level of consciousness: awake and alert Pain management: pain level controlled Vital Signs Assessment: post-procedure vital signs reviewed and stable Respiratory status: spontaneous breathing Cardiovascular status: blood pressure returned to baseline and stable Anesthetic complications: no    Jaci Standard, III,  Lailani Tool D

## 2018-03-16 NOTE — H&P (Signed)
Alyssa Lame, MD [address] [place], [place] [address] Phone: [phone] Fax : [phone]  Primary Care Physician:  Juline Patch, MD Primary Gastroenterologist:  Dr. Allen Norris  Pre-Procedure History & Physical: HPI:  Alyssa Hunt is a 58 y.o. female is here for a screening colonoscopy.   Past Medical History:  Diagnosis Date  . Hypertension     Past Surgical History:  Procedure Laterality Date  . CESAREAN SECTION  1998  . GASTRIC BYPASS      Prior to Admission medications   Medication Sig Start Date End Date Taking? Authorizing Provider  amLODipine (NORVASC) 10 MG tablet Take 1 tablet (10 mg total) by mouth daily. 02/05/18  Yes Juline Patch, MD  carvedilol (COREG) 3.125 MG tablet Take 1 tablet (3.125 mg total) by mouth 2 (two) times daily with a meal. Dr Nehemiah Massed 02/05/18  Yes Juline Patch, MD  hydrochlorothiazide (HYDRODIURIL) 25 MG tablet TAKE 1 TABLET BY MOUTH DAILY ALONG WITH LOSARTAN DOSE 02/05/18  Yes Juline Patch, MD  losartan (COZAAR) 100 MG tablet TAKE 1 TABLET BY MOUTH EVERY DAY ALONG WITH HYDROCHLOROTHIAZIDE DOSE 02/05/18  Yes Juline Patch, MD    Allergies as of 02/05/2018  . (No Known Allergies)    Family History  Problem Relation Age of Onset  . Hypertension Father   . Diabetes Father   . Hypertension Mother   . Diabetes Mother   . Breast cancer Maternal Aunt     Social History   Socioeconomic History  . Marital status: Widowed    Spouse name: Not on file  . Number of children: Not on file  . Years of education: Not on file  . Highest education level: Not on file  Occupational History  . Not on file  Social Needs  . Financial resource strain: Not on file  . Food insecurity:    Worry: Not on file    Inability: Not on file  . Transportation needs:    Medical: Not on file    Non-medical: Not on file  Tobacco Use  . Smoking status: Former Smoker    Last attempt to quit: 1997    Years since quitting:  23.0  . Smokeless tobacco: Never Used  Substance and Sexual Activity  . Alcohol use: Yes    Alcohol/week: 0.0 standard drinks    Comment: may have 1-2 drinks/month  . Drug use: No  . Sexual activity: Yes    Birth control/protection: Post-menopausal  Lifestyle  . Physical activity:    Days per week: Not on file    Minutes per session: Not on file  . Stress: Not on file  Relationships  . Social connections:    Talks on phone: Not on file    Gets together: Not on file    Attends religious service: Not on file    Active member of club or organization: Not on file    Attends meetings of clubs or organizations: Not on file    Relationship status: Not on file  . Intimate partner violence:    Fear of current or ex partner: Not on file    Emotionally abused: Not on file    Physically abused: Not on file    Forced sexual activity: Not on file  Other Topics Concern  . Not on file  Social History Narrative  . Not on file    Review of Systems: See HPI, otherwise negative ROS  Physical Exam: BP (!) 197/98  Pulse 75   Temp 97.7 F (36.5 C) (Temporal)   Ht 5\' 3"  (1.6 m)   Wt 90.7 kg   SpO2 100%   BMI 35.43 kg/m  General:   Alert,  pleasant and cooperative in NAD Head:  Normocephalic and atraumatic. Neck:  Supple; no masses or thyromegaly. Lungs:  Clear throughout to auscultation.    Heart:  Regular rate and rhythm. Abdomen:  Soft, nontender and nondistended. Normal bowel sounds, without guarding, and without rebound.   Neurologic:  Alert and  oriented x4;  grossly normal neurologically.  Impression/Plan: Alyssa Hunt is now here to undergo a screening colonoscopy.  Risks, benefits, and alternatives regarding colonoscopy have been reviewed with the patient.  Questions have been answered.  All parties agreeable.

## 2018-03-16 NOTE — Op Note (Signed)
[facility] Gastroenterology Patient Name: Alyssa Hunt Procedure Date: 03/16/2018 8:06 AM MRN: [id number] Account #: [id number] Date of Birth: 1960/10/23 Admit Type: Outpatient Age: 58 Room: [facility] OR ROOM 01 Gender: Female Note Status: Finalized Procedure:            Colonoscopy Indications:          Screening for colorectal malignant neoplasm Providers:            Lucilla Lame MD, MD Referring MD:         Juline Patch, MD (Referring MD) Medicines:            Propofol per Anesthesia Complications:        No immediate complications. Procedure:            Pre-Anesthesia Assessment:                       - Prior to the procedure, a History and Physical was                        performed, and patient medications and allergies were                        reviewed. The patient's tolerance of previous                        anesthesia was also reviewed. The risks and benefits of                        the procedure and the sedation options and risks were                        discussed with the patient. All questions were                        answered, and informed consent was obtained. Prior                        Anticoagulants: The patient has taken no previous                        anticoagulant or antiplatelet agents. ASA Grade                        Assessment: II - A patient with mild systemic disease.                        After reviewing the risks and benefits, the patient was                        deemed in satisfactory condition to undergo the                        procedure.                       After obtaining informed consent, the colonoscope was                        passed under direct vision. Throughout the procedure,  the patient's blood pressure, pulse, and oxygen                        saturations were monitored continuously. The                        Colonoscope was introduced through the anus and            advanced to the the cecum, identified by appendiceal                        orifice and ileocecal valve. The colonoscopy was                        performed without difficulty. The patient tolerated the                        procedure well. The quality of the bowel preparation                        was excellent. Findings:      The perianal and digital rectal examinations were normal.      A 3 mm polyp was found in the cecum. The polyp was sessile. The polyp       was removed with a cold snare. Resection and retrieval were complete.      A 5 mm polyp was found in the ascending colon. The polyp was sessile.       The polyp was removed with a cold snare. Resection and retrieval were       complete.      A 2 mm polyp was found in the transverse colon. The polyp was sessile.       The polyp was removed with a cold snare. Resection and retrieval were       complete.      Multiple small-mouthed diverticula were found in the sigmoid colon.      Non-bleeding internal hemorrhoids were found during retroflexion. The       hemorrhoids were Grade I (internal hemorrhoids that do not prolapse). Impression:           - One 3 mm polyp in the cecum, removed with a cold                        snare. Resected and retrieved.                       - One 5 mm polyp in the ascending colon, removed with a                        cold snare. Resected and retrieved.                       - One 2 mm polyp in the transverse colon, removed with                        a cold snare. Resected and retrieved.                       - Diverticulosis in the sigmoid colon.                       -  Non-bleeding internal hemorrhoids. Recommendation:       - Discharge patient to home.                       - Resume previous diet.                       - Continue present medications.                       - Await pathology results.                       - Repeat colonoscopy in 5 years if polyp adenoma and 10                         years if hyperplastic Procedure Code(s):    --- Professional ---                       [phone], Colonoscopy, flexible; with removal of tumor(s),                        polyp(s), or other lesion(s) by snare technique Diagnosis Code(s):    --- Professional ---                       Z12.11, Encounter for screening for malignant neoplasm                        of colon                       D12.0, Benign neoplasm of cecum                       D12.2, Benign neoplasm of ascending colon                       D12.3, Benign neoplasm of transverse colon (hepatic                        flexure or splenic flexure) CPT copyright 2018 American Medical Association. All rights reserved. The codes documented in this report are preliminary and upon coder review may  be revised to meet current compliance requirements. Lucilla Lame MD, MD 03/16/2018 8:30:49 AM This report has been signed electronically. Number of Addenda: 0 Note Initiated On: 03/16/2018 8:06 AM Scope Withdrawal Time: 0 hours 10 minutes 13 seconds  Total Procedure Duration: 0 hours 16 minutes 1 second       [facility]

## 2018-03-20 ENCOUNTER — Encounter: Payer: Self-pay | Admitting: Gastroenterology

## 2018-08-03 ENCOUNTER — Encounter: Payer: Self-pay | Admitting: Family Medicine

## 2018-08-03 ENCOUNTER — Other Ambulatory Visit: Payer: Self-pay | Admitting: Family Medicine

## 2018-08-03 ENCOUNTER — Ambulatory Visit: Payer: Federal, State, Local not specified - PPO | Admitting: Family Medicine

## 2018-08-03 ENCOUNTER — Other Ambulatory Visit: Payer: Self-pay

## 2018-08-03 VITALS — BP 138/88 | HR 62 | Ht 63.0 in | Wt 200.0 lb

## 2018-08-03 DIAGNOSIS — I10 Essential (primary) hypertension: Secondary | ICD-10-CM

## 2018-08-03 DIAGNOSIS — N183 Chronic kidney disease, stage 3 unspecified: Secondary | ICD-10-CM

## 2018-08-03 DIAGNOSIS — I5022 Chronic systolic (congestive) heart failure: Secondary | ICD-10-CM | POA: Diagnosis not present

## 2018-08-03 DIAGNOSIS — I517 Cardiomegaly: Secondary | ICD-10-CM

## 2018-08-03 DIAGNOSIS — E7800 Pure hypercholesterolemia, unspecified: Secondary | ICD-10-CM | POA: Diagnosis not present

## 2018-08-03 DIAGNOSIS — E6609 Other obesity due to excess calories: Secondary | ICD-10-CM

## 2018-08-03 DIAGNOSIS — Z6836 Body mass index (BMI) 36.0-36.9, adult: Secondary | ICD-10-CM

## 2018-08-03 MED ORDER — LOSARTAN POTASSIUM 100 MG PO TABS: ORAL_TABLET | ORAL | 1 refills | Status: DC

## 2018-08-03 MED ORDER — AMLODIPINE BESYLATE 10 MG PO TABS: 10.0000 mg | ORAL_TABLET | Freq: Every day | ORAL | 1 refills | Status: DC

## 2018-08-03 MED ORDER — CARVEDILOL 3.125 MG PO TABS: 3.1250 mg | ORAL_TABLET | Freq: Two times a day (BID) | ORAL | 1 refills | Status: DC

## 2018-08-03 MED ORDER — HYDROCHLOROTHIAZIDE 25 MG PO TABS: ORAL_TABLET | ORAL | 1 refills | Status: DC

## 2018-08-03 NOTE — Patient Instructions (Signed)
Mediterranean Diet  A Mediterranean diet refers to food and lifestyle choices that are based on the traditions of countries located on the [place]. This way of eating has been shown to help prevent certain conditions and improve outcomes for people who have chronic diseases, like kidney disease and heart disease.  What are tips for following this plan?  Lifestyle   Cook and eat meals together with your family, when possible.   Drink enough fluid to keep your urine clear or pale yellow.   Be physically active every day. This includes:  ? Aerobic exercise like running or swimming.  ? Leisure activities like gardening, walking, or housework.   Get 7-8 hours of sleep each night.   If recommended by your health care provider, drink red wine in moderation. This means 1 glass a day for nonpregnant women and 2 glasses a day for men. A glass of wine equals 5 oz (150 mL).  Reading food labels     Check the serving size of packaged foods. For foods such as rice and pasta, the serving size refers to the amount of cooked product, not dry.   Check the total fat in packaged foods. Avoid foods that have saturated fat or trans fats.   Check the ingredients list for added sugars, such as corn syrup.  Shopping   At the grocery store, buy most of your food from the areas near the walls of the store. This includes:  ? Fresh fruits and vegetables (produce).  ? Grains, beans, nuts, and seeds. Some of these may be available in unpackaged forms or large amounts (in bulk).  ? Fresh seafood.  ? Poultry and eggs.  ? Low-fat dairy products.   Buy whole ingredients instead of prepackaged foods.   Buy fresh fruits and vegetables in-season from local farmers markets.   Buy frozen fruits and vegetables in resealable bags.   If you do not have access to quality fresh seafood, buy precooked frozen shrimp or canned fish, such as tuna, salmon, or sardines.   Buy small amounts of raw or cooked vegetables, salads, or olives from  the deli or salad bar at your store.   Stock your pantry so you always have certain foods on hand, such as olive oil, canned tuna, canned tomatoes, rice, pasta, and beans.  Cooking   Cook foods with extra-virgin olive oil instead of using butter or other vegetable oils.   Have meat as a side dish, and have vegetables or grains as your main dish. This means having meat in small portions or adding small amounts of meat to foods like pasta or stew.   Use beans or vegetables instead of meat in common dishes like chili or lasagna.   Experiment with different cooking methods. Try roasting or broiling vegetables instead of steaming or sauteing them.   Add frozen vegetables to soups, stews, pasta, or rice.   Add nuts or seeds for added healthy fat at each meal. You can add these to yogurt, salads, or vegetable dishes.   Marinate fish or vegetables using olive oil, lemon juice, garlic, and fresh herbs.  Meal planning     Plan to eat 1 vegetarian meal one day each week. Try to work up to 2 vegetarian meals, if possible.   Eat seafood 2 or more times a week.   Have healthy snacks readily available, such as:  ? Vegetable sticks with hummus.  ? Greek yogurt.  ? Fruit and nut trail mix.   Eat balanced   meals throughout the week. This includes:  ? Fruit: 2-3 servings a day  ? Vegetables: 4-5 servings a day  ? Low-fat dairy: 2 servings a day  ? Fish, poultry, or lean meat: 1 serving a day  ? Beans and legumes: 2 or more servings a week  ? Nuts and seeds: 1-2 servings a day  ? Whole grains: 6-8 servings a day  ? Extra-virgin olive oil: 3-4 servings a day   Limit red meat and sweets to only a few servings a month  What are my food choices?   Mediterranean diet  ? Recommended  ? Grains: Whole-grain pasta. Brown rice. Bulgar wheat. Polenta. Couscous. Whole-wheat bread. Oatmeal. Quinoa.  ? Vegetables: Artichokes. Beets. Broccoli. Cabbage. Carrots. Eggplant. Green beans. Chard. Kale. Spinach. Onions. Leeks. Peas. Squash.  Tomatoes. Peppers. Radishes.  ? Fruits: Apples. Apricots. Avocado. Berries. Bananas. Cherries. Dates. Figs. Grapes. Lemons. Melon. Oranges. Peaches. Plums. Pomegranate.  ? Meats and other protein foods: Beans. Almonds. Sunflower seeds. Pine nuts. Peanuts. Cod. Salmon. Scallops. Shrimp. Tuna. Tilapia. Clams. Oysters. Eggs.  ? Dairy: Low-fat milk. Cheese. Greek yogurt.  ? Beverages: Water. Red wine. Herbal tea.  ? Fats and oils: Extra virgin olive oil. Avocado oil. Grape seed oil.  ? Sweets and desserts: Greek yogurt with honey. Baked apples. Poached pears. Trail mix.  ? Seasoning and other foods: Basil. Cilantro. Coriander. Cumin. Mint. Parsley. Sage. Rosemary. Tarragon. Garlic. Oregano. Thyme. Pepper. Balsalmic vinegar. Tahini. Hummus. Tomato sauce. Olives. Mushrooms.  ? Limit these  ? Grains: Prepackaged pasta or rice dishes. Prepackaged cereal with added sugar.  ? Vegetables: Deep fried potatoes (french fries).  ? Fruits: Fruit canned in syrup.  ? Meats and other protein foods: Beef. Pork. Lamb. Poultry with skin. Hot dogs. Bacon.  ? Dairy: Ice cream. Sour cream. Whole milk.  ? Beverages: Juice. Sugar-sweetened soft drinks. Beer. Liquor and spirits.  ? Fats and oils: Butter. Canola oil. Vegetable oil. Beef fat (tallow). Lard.  ? Sweets and desserts: Cookies. Cakes. Pies. Candy.  ? Seasoning and other foods: Mayonnaise. Premade sauces and marinades.  ? The items listed may not be a complete list. Talk with your dietitian about what dietary choices are right for you.  Summary   The Mediterranean diet includes both food and lifestyle choices.   Eat a variety of fresh fruits and vegetables, beans, nuts, seeds, and whole grains.   Limit the amount of red meat and sweets that you eat.   Talk with your health care provider about whether it is safe for you to drink red wine in moderation. This means 1 glass a day for nonpregnant women and 2 glasses a day for men. A glass of wine equals 5 oz (150 mL).  This information  is not intended to replace advice given to you by your health care provider. Make sure you discuss any questions you have with your health care provider.  Document Released: 09/24/2015 Document Revised: 10/27/2015 Document Reviewed: 09/24/2015  Elsevier Interactive Patient Education  2019 [employer]

## 2018-08-03 NOTE — Progress Notes (Signed)
Date:  08/03/2018   Name:  Alyssa Hunt   DOB:  02-26-1960   MRN:  [id number]   Chief Complaint: Hypertension  Hypertension This is a chronic problem. The current episode started more than 1 year ago. The problem is unchanged. The problem is controlled. Pertinent negatives include no anxiety, blurred vision, chest pain, headaches, malaise/fatigue, neck pain, orthopnea, palpitations, peripheral edema, PND, shortness of breath or sweats. There are no associated agents to hypertension.    Review of Systems  Constitutional: Negative for malaise/fatigue.  Eyes: Negative for blurred vision.  Respiratory: Negative for shortness of breath.   Cardiovascular: Negative for chest pain, palpitations, orthopnea and PND.  Musculoskeletal: Negative for neck pain.  Neurological: Negative for headaches.    Patient Active Problem List   Diagnosis Date Noted  . Special screening for malignant neoplasms, colon   . Benign neoplasm of cecum   . Benign neoplasm of ascending colon   . Benign neoplasm of transverse colon   . Essential hypertension 03/10/2015    No Known Allergies  Past Surgical History:  Procedure Laterality Date  . CESAREAN SECTION  1998  . COLONOSCOPY WITH PROPOFOL N/A 03/16/2018   Procedure: COLONOSCOPY WITH BIOPSIES;  Surgeon: Lucilla Lame, MD;  Location: [place];  Service: Endoscopy;  Laterality: N/A;  . GASTRIC BYPASS    . POLYPECTOMY N/A 03/16/2018   Procedure: POLYPECTOMY;  Surgeon: Lucilla Lame, MD;  Location: [place];  Service: Endoscopy;  Laterality: N/A;    Social History   Tobacco Use  . Smoking status: Former Smoker    Quit date: 1997    Years since quitting: 23.4  . Smokeless tobacco: Never Used  Substance Use Topics  . Alcohol use: Yes    Alcohol/week: 0.0 standard drinks    Comment: may have 1-2 drinks/month  . Drug use: No     Medication list has been reviewed and updated.  Current Meds  Medication Sig  .  amLODipine (NORVASC) 10 MG tablet Take 1 tablet (10 mg total) by mouth daily.  . carvedilol (COREG) 3.125 MG tablet Take 1 tablet (3.125 mg total) by mouth 2 (two) times daily with a meal. Dr Nehemiah Massed  . hydrochlorothiazide (HYDRODIURIL) 25 MG tablet TAKE 1 TABLET BY MOUTH DAILY ALONG WITH LOSARTAN DOSE  . losartan (COZAAR) 100 MG tablet TAKE 1 TABLET BY MOUTH EVERY DAY ALONG WITH HYDROCHLOROTHIAZIDE DOSE    PHQ 2/9 Scores 08/03/2018 02/05/2018 12/26/2017 09/22/2016  PHQ - 2 Score 0 0 0 0  PHQ- 9 Score 0 0 0 0    BP Readings from Last 3 Encounters:  08/03/18 138/88  03/16/18 (!) 142/97  02/05/18 (!) 148/98    Physical Exam  Wt Readings from Last 3 Encounters:  08/03/18 200 lb (90.7 kg)  03/16/18 200 lb (90.7 kg)  02/05/18 207 lb (93.9 kg)    BP 138/88   Pulse 62   Ht 5\' 3"  (1.6 m)   Wt 200 lb (90.7 kg)   BMI 35.43 kg/m   Assessment and Plan: 1. Essential hypertension Chronic.  Controlled.  Patient will continue amlodipine 10 mg once a day Coreg 3.1251 twice a day hydrochlorothiazide 25 mg once a day and losartan 100 mg once a day.  Will check renal function panel.  Will recheck in 6 months - Renal Function Panel - amLODipine (NORVASC) 10 MG tablet; Take 1 tablet (10 mg total) by mouth daily.  Dispense: 90 tablet; Refill: 1 - carvedilol (COREG) 3.125 MG tablet; Take  1 tablet (3.125 mg total) by mouth 2 (two) times daily with a meal. Dr Nehemiah Massed  Dispense: 180 tablet; Refill: 1 - hydrochlorothiazide (HYDRODIURIL) 25 MG tablet; TAKE 1 TABLET BY MOUTH DAILY ALONG WITH LOSARTAN DOSE  Dispense: 90 tablet; Refill: 1 - losartan (COZAAR) 100 MG tablet; TAKE 1 TABLET BY MOUTH EVERY DAY ALONG WITH HYDROCHLOROTHIAZIDE DOSE  Dispense: 90 tablet; Refill: 1  2. Chronic systolic CHF (congestive heart failure) (HCC) Chronic.  Asymptomatic for congestive heart failure.  However it is been 2016 since she is seeing her cardiologist and patient needs to be rechecked on a regular basis. -  Ambulatory referral to Cardiology  3. LVH (left ventricular hypertrophy) Persistent.  Asymptomatic.  Referral to cardiology for reevaluation. - Ambulatory referral to Cardiology  4. CKD (chronic kidney disease) stage 3, GFR 30-59 ml/min (HCC) Patient's last GFR is in the 30-34 range.  We will repeat renal panel.  Previous patient was told to hydrate and recheck and has not been done for several months.  5. Pure hypercholesterolemia Patient has elevated LDH mildly in the past will check lipid panel today. - Lipid panel  6. Class 2 obesity due to excess calories without serious comorbidity with body mass index (BMI) of 36.0 to 36.9 in adult Mediterranean diet given. Health risks of being over weight were discussed and patient was counseled on weight loss options and exercise.

## 2018-08-04 LAB — RENAL FUNCTION PANEL
Albumin: 4.1 g/dL (ref 3.8–4.9)
BUN/Creatinine Ratio: 26 — ABNORMAL HIGH (ref 9–23)
BUN: 32 mg/dL — ABNORMAL HIGH (ref 6–24)
CO2: 26 mmol/L (ref 20–29)
Calcium: 9.6 mg/dL (ref 8.7–10.2)
Chloride: 101 mmol/L (ref 96–106)
Creatinine, Ser: 1.23 mg/dL — ABNORMAL HIGH (ref 0.57–1.00)
GFR calc Af Amer: 56 mL/min/{1.73_m2} — ABNORMAL LOW (ref >59)
GFR calc non Af Amer: 48 mL/min/{1.73_m2} — ABNORMAL LOW (ref >59)
Glucose: 95 mg/dL (ref 65–99)
Phosphorus: 3.4 mg/dL (ref 3.0–4.3)
Potassium: 3.8 mmol/L (ref 3.5–5.2)
Sodium: 142 mmol/L (ref 134–144)

## 2018-08-04 LAB — LIPID PANEL
Chol/HDL Ratio: 2.3 ratio (ref 0.0–4.4)
Cholesterol, Total: 171 mg/dL (ref 100–199)
HDL: 73 mg/dL (ref >39)
LDL Calculated: 84 mg/dL (ref 0–99)
Triglycerides: 71 mg/dL (ref 0–149)
VLDL Cholesterol Cal: 14 mg/dL (ref 5–40)

## 2019-02-05 ENCOUNTER — Ambulatory Visit: Payer: Federal, State, Local not specified - PPO | Admitting: Family Medicine

## 2019-03-02 ENCOUNTER — Other Ambulatory Visit: Payer: Self-pay | Admitting: Family Medicine

## 2019-03-02 DIAGNOSIS — I10 Essential (primary) hypertension: Secondary | ICD-10-CM

## 2019-03-04 ENCOUNTER — Other Ambulatory Visit: Payer: Self-pay

## 2019-03-04 ENCOUNTER — Ambulatory Visit: Payer: Federal, State, Local not specified - PPO | Admitting: Family Medicine

## 2019-03-04 ENCOUNTER — Encounter: Payer: Self-pay | Admitting: Family Medicine

## 2019-03-04 VITALS — BP 130/88 | HR 64 | Ht 63.0 in | Wt 210.0 lb

## 2019-03-04 DIAGNOSIS — Z23 Encounter for immunization: Secondary | ICD-10-CM

## 2019-03-04 DIAGNOSIS — I10 Essential (primary) hypertension: Secondary | ICD-10-CM | POA: Diagnosis not present

## 2019-03-04 DIAGNOSIS — N1831 Chronic kidney disease, stage 3a: Secondary | ICD-10-CM

## 2019-03-04 MED ORDER — AMLODIPINE BESYLATE 10 MG PO TABS: 10.0000 mg | ORAL_TABLET | Freq: Every day | ORAL | 1 refills | Status: DC

## 2019-03-04 MED ORDER — LOSARTAN POTASSIUM 100 MG PO TABS: ORAL_TABLET | ORAL | 1 refills | Status: DC

## 2019-03-04 MED ORDER — HYDROCHLOROTHIAZIDE 25 MG PO TABS: ORAL_TABLET | ORAL | 1 refills | Status: DC

## 2019-03-04 NOTE — Progress Notes (Signed)
Date:  03/04/2019   Name:  Alyssa Hunt   DOB:  [birth year]   MRN:  [id number]   Chief Complaint: Hypertension and Flu Vaccine  Hypertension This is a chronic problem. The current episode started more than 1 year ago. The problem has been gradually improving since onset. The problem is controlled. Pertinent negatives include no anxiety, blurred vision, chest pain, headaches, malaise/fatigue, neck pain, orthopnea, palpitations, peripheral edema, PND, shortness of breath or sweats. There are no associated agents to hypertension. Risk factors for coronary artery disease include obesity. Past treatments include angiotensin blockers, diuretics and calcium channel blockers. The current treatment provides moderate improvement. There are no compliance problems.  Hypertensive end-organ damage includes kidney disease. There is no history of angina, CAD/MI, CVA, heart failure, left ventricular hypertrophy, PVD or retinopathy. There is no history of a hypertension causing med or renovascular disease.    Lab Results  Component Value Date   CREATININE 1.23 (H) 08/03/2018   BUN 32 (H) 08/03/2018   NA 142 08/03/2018   K 3.8 08/03/2018   CL 101 08/03/2018   CO2 26 08/03/2018   Lab Results  Component Value Date   CHOL 171 08/03/2018   HDL 73 08/03/2018   LDLCALC 84 08/03/2018   TRIG 71 08/03/2018   CHOLHDL 2.3 08/03/2018   No results found for: TSH No results found for: HGBA1C   Review of Systems  Constitutional: Negative.  Negative for chills, fatigue, fever, malaise/fatigue and unexpected weight change.  HENT: Negative for congestion, ear discharge, ear pain, rhinorrhea, sinus pressure, sneezing and sore throat.   Eyes: Negative for blurred vision, photophobia, pain, discharge, redness and itching.  Respiratory: Negative for cough, shortness of breath, wheezing and stridor.   Cardiovascular: Negative for chest pain, palpitations, orthopnea and PND.  Gastrointestinal: Negative for  abdominal pain, blood in stool, constipation, diarrhea, nausea and vomiting.  Endocrine: Negative for cold intolerance, heat intolerance, polydipsia, polyphagia and polyuria.  Genitourinary: Negative for dysuria, flank pain, frequency, hematuria, menstrual problem, pelvic pain, urgency, vaginal bleeding and vaginal discharge.  Musculoskeletal: Negative for arthralgias, back pain, myalgias and neck pain.  Skin: Negative for rash.  Allergic/Immunologic: Negative for environmental allergies and food allergies.  Neurological: Negative for dizziness, weakness, light-headedness, numbness and headaches.  Hematological: Negative for adenopathy. Does not bruise/bleed easily.  Psychiatric/Behavioral: Negative for dysphoric mood. The patient is not nervous/anxious.     Patient Active Problem List   Diagnosis Date Noted  . Special screening for malignant neoplasms, colon   . Benign neoplasm of cecum   . Benign neoplasm of ascending colon   . Benign neoplasm of transverse colon   . Essential hypertension 03/10/2015    No Known Allergies  Past Surgical History:  Procedure Laterality Date  . CESAREAN SECTION  1998  . COLONOSCOPY WITH PROPOFOL N/A 03/16/2018   Procedure: COLONOSCOPY WITH BIOPSIES;  Surgeon: Lucilla Lame, MD;  Location: [place];  Service: Endoscopy;  Laterality: N/A;  . GASTRIC BYPASS    . POLYPECTOMY N/A 03/16/2018   Procedure: POLYPECTOMY;  Surgeon: Lucilla Lame, MD;  Location: [place];  Service: Endoscopy;  Laterality: N/A;    Social History   Tobacco Use  . Smoking status: Former Smoker    Quit date: 1997    Years since quitting: 24.0  . Smokeless tobacco: Never Used  Substance Use Topics  . Alcohol use: Yes    Alcohol/week: 0.0 standard drinks    Comment: may have 1-2 drinks/month  . Drug  use: No     Medication list has been reviewed and updated.  Current Meds  Medication Sig  . amLODipine (NORVASC) 10 MG tablet Take 1 tablet (10 mg total)  by mouth daily.  . carvedilol (COREG) 3.125 MG tablet Take 1 tablet (3.125 mg total) by mouth 2 (two) times daily with a meal. Dr Nehemiah Massed  . hydrochlorothiazide (HYDRODIURIL) 25 MG tablet TAKE 1 TABLET BY MOUTH DAILY ALONG WITH LOSARTAN DOSE  . losartan (COZAAR) 100 MG tablet TAKE 1 TABLET BY MOUTH EVERY DAY ALONG WITH HYDROCHLOROTHIAZIDE DOSE    PHQ 2/9 Scores 03/04/2019 08/03/2018 02/05/2018 12/26/2017  PHQ - 2 Score 0 0 0 0  PHQ- 9 Score 0 0 0 0    BP Readings from Last 3 Encounters:  03/04/19 130/88  08/03/18 138/88  03/16/18 (!) 142/97    Physical Exam Vitals and nursing note reviewed.  Constitutional:      Appearance: She is well-developed.  HENT:     Head: Normocephalic.     Right Ear: External ear normal.     Left Ear: External ear normal.  Eyes:     General: Lids are everted, no foreign bodies appreciated. No scleral icterus.       Left eye: No foreign body or hordeolum.     Conjunctiva/sclera: Conjunctivae normal.     Right eye: Right conjunctiva is not injected.     Left eye: Left conjunctiva is not injected.     Pupils: Pupils are equal, round, and reactive to light.  Neck:     Thyroid: No thyromegaly.     Vascular: No JVD.     Trachea: No tracheal deviation.  Cardiovascular:     Rate and Rhythm: Normal rate and regular rhythm.     Heart sounds: Normal heart sounds. No murmur. No friction rub. No gallop.   Pulmonary:     Effort: Pulmonary effort is normal. No respiratory distress.     Breath sounds: Normal breath sounds. No wheezing or rales.  Abdominal:     General: Bowel sounds are normal.     Palpations: Abdomen is soft. There is no mass.     Tenderness: There is no abdominal tenderness. There is no guarding or rebound.  Musculoskeletal:        General: No tenderness. Normal range of motion.     Cervical back: Normal range of motion and neck supple.  Lymphadenopathy:     Cervical: No cervical adenopathy.  Skin:    General: Skin is warm.     Findings:  No rash.  Neurological:     Mental Status: She is alert and oriented to person, place, and time.     Cranial Nerves: No cranial nerve deficit.     Deep Tendon Reflexes: Reflexes normal.  Psychiatric:        Mood and Affect: Mood is not anxious or depressed.     Wt Readings from Last 3 Encounters:  03/04/19 210 lb (95.3 kg)  08/03/18 200 lb (90.7 kg)  03/16/18 200 lb (90.7 kg)    BP 130/88   Pulse 64   Ht 5\' 3"  (1.6 m)   Wt 210 lb (95.3 kg)   BMI 37.20 kg/m   Assessment and Plan:  1. Essential hypertension Chronic.  Controlled.  Stable.  Continue losartan 100 mg once a day.,  Hydrochlorothiazide 25 mg 1 a day, and amlodipine 10 mg once a day. - losartan (COZAAR) 100 MG tablet; TAKE 1 TABLET BY MOUTH EVERY DAY ALONG WITH HYDROCHLOROTHIAZIDE  DOSE  Dispense: 90 tablet; Refill: 1 - hydrochlorothiazide (HYDRODIURIL) 25 MG tablet; TAKE 1 TABLET BY MOUTH DAILY ALONG WITH LOSARTAN DOSE  Dispense: 90 tablet; Refill: 1 - amLODipine (NORVASC) 10 MG tablet; Take 1 tablet (10 mg total) by mouth daily.  Dispense: 90 tablet; Refill: 1 - Renal Function Panel  2. Stage 3a chronic kidney disease Patient has GFR in the 59 to high 40 range.  This is consistent with stage IIIa.  This also appears to be influenced by volume/fluid status and patient has been encouraged to drink fluids on a regular basis so as not to underperfused the kidneys.  We will recheck a renal function panel for evaluation. - Renal Function Panel

## 2019-03-04 NOTE — Addendum Note (Signed)
Addended by: Fredderick Severance on: 03/04/2019 09:45 AM   Modules accepted: Orders

## 2019-03-05 LAB — RENAL FUNCTION PANEL
Albumin: 4 g/dL (ref 3.8–4.9)
BUN/Creatinine Ratio: 23 (ref 9–23)
BUN: 31 mg/dL — ABNORMAL HIGH (ref 6–24)
CO2: 23 mmol/L (ref 20–29)
Calcium: 9.3 mg/dL (ref 8.7–10.2)
Chloride: 102 mmol/L (ref 96–106)
Creatinine, Ser: 1.32 mg/dL — ABNORMAL HIGH (ref 0.57–1.00)
GFR calc Af Amer: 51 mL/min/{1.73_m2} — ABNORMAL LOW (ref >59)
GFR calc non Af Amer: 45 mL/min/{1.73_m2} — ABNORMAL LOW (ref >59)
Glucose: 88 mg/dL (ref 65–99)
Phosphorus: 3.3 mg/dL (ref 3.0–4.3)
Potassium: 3.9 mmol/L (ref 3.5–5.2)
Sodium: 142 mmol/L (ref 134–144)

## 2019-07-03 DIAGNOSIS — L987 Excessive and redundant skin and subcutaneous tissue: Secondary | ICD-10-CM | POA: Diagnosis not present

## 2019-09-03 ENCOUNTER — Ambulatory Visit: Payer: Federal, State, Local not specified - PPO | Admitting: Family Medicine

## 2019-09-08 ENCOUNTER — Other Ambulatory Visit: Payer: Self-pay | Admitting: Family Medicine

## 2019-09-08 DIAGNOSIS — I10 Essential (primary) hypertension: Secondary | ICD-10-CM

## 2019-09-08 NOTE — Telephone Encounter (Signed)
Requested Prescriptions  Pending Prescriptions Disp Refills  . hydrochlorothiazide (HYDRODIURIL) 25 MG tablet [Pharmacy Med Name: HYDROCHLOROTHIAZIDE 25 MG TAB] 90 tablet 0    Sig: TAKE 1 TABLET BY MOUTH DAILY ALONG WITH LOSARTAN DOSE     Cardiovascular: Diuretics - Thiazide Failed - 09/08/2019  9:28 AM      Failed - Cr in normal range and within 360 days    Creatinine, Ser  Date Value Ref Range Status  03/04/2019 1.32 (H) 0.57 - 1.00 mg/dL Final         Failed - Valid encounter within last 6 months    Recent Outpatient Visits          6 months ago Essential hypertension   [facility] Juline Patch, MD   1 year ago Essential hypertension   [facility] Juline Patch, MD   1 year ago Annual physical exam   [place], Deanna C, MD   1 year ago Essential hypertension   [place], Deanna C, MD   2 years ago Essential hypertension   [place], [place]      Future Appointments            In 1 week Juline Patch, MD [facility] [place] in normal range and within 360 days    Calcium  Date Value Ref Range Status  03/04/2019 9.3 8.7 - 10.2 mg/dL Final         Passed - K in normal range and within 360 days    Potassium  Date Value Ref Range Status  03/04/2019 3.9 3.5 - 5.2 mmol/L Final         Passed - Na in normal range and within 360 days    Sodium  Date Value Ref Range Status  03/04/2019 142 134 - 144 mmol/L Final         Passed - Last BP in normal range    BP Readings from Last 1 Encounters:  03/04/19 130/88         . amLODipine (NORVASC) 10 MG tablet [Pharmacy Med Name: AMLODIPINE BESYLATE 10 MG TAB] 90 tablet 0    Sig: TAKE 1 TABLET BY MOUTH EVERY DAY     Cardiovascular:  Calcium Channel Blockers Failed - 09/08/2019  9:28 AM      Failed - Valid encounter within last 6 months    Recent Outpatient Visits          6 months ago Essential hypertension    [facility] Juline Patch, MD   1 year ago Essential hypertension   [facility] Juline Patch, MD   1 year ago Annual physical exam   [facility] Juline Patch, MD   1 year ago Essential hypertension   [place], Deanna C, MD   2 years ago Essential hypertension   [place], Deanna C, MD      Future Appointments            In 1 week Juline Patch, MD [facility] [place] BP in normal range    BP Readings from Last 1 Encounters:  03/04/19 130/88

## 2019-09-18 ENCOUNTER — Other Ambulatory Visit: Payer: Self-pay

## 2019-09-18 ENCOUNTER — Ambulatory Visit: Payer: Federal, State, Local not specified - PPO | Admitting: Family Medicine

## 2019-09-18 ENCOUNTER — Encounter: Payer: Self-pay | Admitting: Family Medicine

## 2019-09-18 VITALS — BP 146/98 | HR 84 | Ht 63.0 in | Wt 185.0 lb

## 2019-09-18 DIAGNOSIS — I10 Essential (primary) hypertension: Secondary | ICD-10-CM | POA: Diagnosis not present

## 2019-09-18 MED ORDER — HYDROCHLOROTHIAZIDE 25 MG PO TABS: ORAL_TABLET | ORAL | 1 refills | Status: DC

## 2019-09-18 MED ORDER — LOSARTAN POTASSIUM 100 MG PO TABS: ORAL_TABLET | ORAL | 1 refills | Status: DC

## 2019-09-18 MED ORDER — CARVEDILOL 3.125 MG PO TABS: 3.1250 mg | ORAL_TABLET | Freq: Two times a day (BID) | ORAL | 1 refills | Status: DC

## 2019-09-18 MED ORDER — AMLODIPINE BESYLATE 10 MG PO TABS: 10.0000 mg | ORAL_TABLET | Freq: Every day | ORAL | 1 refills | Status: DC

## 2019-09-18 NOTE — Progress Notes (Signed)
Date:  09/18/2019   Name:  Alyssa Hunt   DOB:  May 29, 1960   MRN:  [id number]   Chief Complaint: Hypertension (follow up )  Hypertension This is a chronic problem. The current episode started more than 1 year ago. The problem has been waxing and waning since onset. The problem is controlled (no meds this morning). Pertinent negatives include no anxiety, blurred vision, chest pain, headaches, malaise/fatigue, neck pain, orthopnea, palpitations, peripheral edema, PND, shortness of breath or sweats. There are no associated agents (tylenol pm) to hypertension.    Lab Results  Component Value Date   CREATININE 1.32 (H) 03/04/2019   BUN 31 (H) 03/04/2019   NA 142 03/04/2019   K 3.9 03/04/2019   CL 102 03/04/2019   CO2 23 03/04/2019   Lab Results  Component Value Date   CHOL 171 08/03/2018   HDL 73 08/03/2018   LDLCALC 84 08/03/2018   TRIG 71 08/03/2018   CHOLHDL 2.3 08/03/2018   No results found for: TSH No results found for: HGBA1C No results found for: WBC, HGB, HCT, MCV, PLT No results found for: ALT, AST, GGT, ALKPHOS, BILITOT   Review of Systems  Constitutional: Negative.  Negative for chills, fatigue, fever, malaise/fatigue and unexpected weight change.  HENT: Negative for congestion, ear discharge, ear pain, rhinorrhea, sinus pressure, sneezing and sore throat.   Eyes: Negative for blurred vision, photophobia, pain, discharge, redness and itching.  Respiratory: Negative for cough, shortness of breath, wheezing and stridor.   Cardiovascular: Negative for chest pain, palpitations, orthopnea and PND.  Gastrointestinal: Negative for abdominal pain, blood in stool, constipation, diarrhea, nausea and vomiting.  Endocrine: Negative for cold intolerance, heat intolerance, polydipsia, polyphagia and polyuria.  Genitourinary: Negative for dysuria, flank pain, frequency, hematuria, menstrual problem, pelvic pain, urgency, vaginal bleeding and vaginal discharge.    Musculoskeletal: Negative for arthralgias, back pain, myalgias and neck pain.  Skin: Negative for rash.  Allergic/Immunologic: Negative for environmental allergies and food allergies.  Neurological: Negative for dizziness, weakness, light-headedness, numbness and headaches.  Hematological: Negative for adenopathy. Does not bruise/bleed easily.  Psychiatric/Behavioral: Negative for dysphoric mood. The patient is not nervous/anxious.     Patient Active Problem List   Diagnosis Date Noted  . Special screening for malignant neoplasms, colon   . Benign neoplasm of cecum   . Benign neoplasm of ascending colon   . Benign neoplasm of transverse colon   . Essential hypertension 03/10/2015    No Known Allergies  Past Surgical History:  Procedure Laterality Date  . CESAREAN SECTION  1998  . COLONOSCOPY WITH PROPOFOL N/A 03/16/2018   Procedure: COLONOSCOPY WITH BIOPSIES;  Surgeon: Lucilla Lame, MD;  Location: [place];  Service: Endoscopy;  Laterality: N/A;  . GASTRIC BYPASS    . POLYPECTOMY N/A 03/16/2018   Procedure: POLYPECTOMY;  Surgeon: Lucilla Lame, MD;  Location: [place];  Service: Endoscopy;  Laterality: N/A;    Social History   Tobacco Use  . Smoking status: Former Smoker    Quit date: 1997    Years since quitting: 24.6  . Smokeless tobacco: Never Used  Vaping Use  . Vaping Use: Never used  Substance Use Topics  . Alcohol use: Yes    Alcohol/week: 0.0 standard drinks    Comment: may have 1-2 drinks/month  . Drug use: No     Medication list has been reviewed and updated.  Current Meds  Medication Sig  . amLODipine (NORVASC) 10 MG tablet TAKE 1 TABLET  BY MOUTH EVERY DAY  . carvedilol (COREG) 3.125 MG tablet Take 1 tablet (3.125 mg total) by mouth 2 (two) times daily with a meal. Dr Nehemiah Massed  . hydrochlorothiazide (HYDRODIURIL) 25 MG tablet TAKE 1 TABLET BY MOUTH DAILY ALONG WITH LOSARTAN DOSE  . losartan (COZAAR) 100 MG tablet TAKE 1 TABLET BY  MOUTH EVERY DAY ALONG WITH HYDROCHLOROTHIAZIDE DOSE    PHQ 2/9 Scores 09/18/2019 03/04/2019 08/03/2018 02/05/2018  PHQ - 2 Score 0 0 0 0  PHQ- 9 Score 1 0 0 0    GAD 7 : Generalized Anxiety Score 09/18/2019 03/04/2019  Nervous, Anxious, on Edge 1 0  Control/stop worrying 1 0  Worry too much - different things 1 0  Trouble relaxing 0 0  Restless 0 0  Easily annoyed or irritable 0 0  Afraid - awful might happen 0 0  Total GAD 7 Score 3 0  Anxiety Difficulty Not difficult at all -    BP Readings from Last 3 Encounters:  09/18/19 (!) 146/98  03/04/19 130/88  08/03/18 138/88    Physical Exam Vitals and nursing note reviewed.  Constitutional:      Appearance: She is well-developed.  HENT:     Head: Normocephalic.     Right Ear: Tympanic membrane, ear canal and external ear normal.     Left Ear: Tympanic membrane, ear canal and external ear normal.     Nose: Nose normal. No congestion or rhinorrhea.     Mouth/Throat:     Mouth: Mucous membranes are moist.  Eyes:     General: Lids are everted, no foreign bodies appreciated. No scleral icterus.       Left eye: No foreign body or hordeolum.     Conjunctiva/sclera: Conjunctivae normal.     Right eye: Right conjunctiva is not injected.     Left eye: Left conjunctiva is not injected.     Pupils: Pupils are equal, round, and reactive to light.  Neck:     Thyroid: No thyromegaly.     Vascular: No JVD.     Trachea: No tracheal deviation.  Cardiovascular:     Rate and Rhythm: Normal rate and regular rhythm.     Heart sounds: Normal heart sounds. No murmur heard.  No friction rub. No gallop.   Pulmonary:     Effort: Pulmonary effort is normal. No respiratory distress.     Breath sounds: Normal breath sounds. No wheezing, rhonchi or rales.  Abdominal:     General: Bowel sounds are normal.     Palpations: Abdomen is soft. There is no mass.     Tenderness: There is no abdominal tenderness. There is no guarding or rebound.    Musculoskeletal:        General: No tenderness. Normal range of motion.     Cervical back: Normal range of motion and neck supple.  Lymphadenopathy:     Cervical: No cervical adenopathy.  Skin:    General: Skin is warm.     Findings: No rash.  Neurological:     Mental Status: She is alert and oriented to person, place, and time.     Cranial Nerves: No cranial nerve deficit.     Deep Tendon Reflexes: Reflexes normal.  Psychiatric:        Mood and Affect: Mood is not anxious or depressed.     Wt Readings from Last 3 Encounters:  09/18/19 185 lb (83.9 kg)  03/04/19 210 lb (95.3 kg)  08/03/18 200 lb (90.7 kg)  BP (!) 146/98   Pulse 84   Ht 5\' 3"  (1.6 m)   Wt 185 lb (83.9 kg)   SpO2 97%   BMI 32.77 kg/m   Assessment and Plan: 1. Essential hypertension Chronic.  Controlled.  Although patient did not take medication today and blood pressure is elevated.  Stable.  Uncomplicated.  Continue amlodipine 10 mg once a day.  We will also continue hydrochlorothiazide 25 mg once a day, losartan 100 mg once a day, and carvedilol 3.1251 twice a day.  We will check renal function panel to evaluate electrolytes and GFR. - amLODipine (NORVASC) 10 MG tablet; Take 1 tablet (10 mg total) by mouth daily.  Dispense: 90 tablet; Refill: 1 - hydrochlorothiazide (HYDRODIURIL) 25 MG tablet; One a day  Dispense: 90 tablet; Refill: 1 - losartan (COZAAR) 100 MG tablet; TAKE 1 TABLET BY MOUTH EVERY DAY ALONG WITH HYDROCHLOROTHIAZIDE DOSE  Dispense: 90 tablet; Refill: 1 - carvedilol (COREG) 3.125 MG tablet; Take 1 tablet (3.125 mg total) by mouth 2 (two) times daily with a meal. Dr Nehemiah Massed  Dispense: 180 tablet; Refill: 1 - Renal Function Panel

## 2019-09-19 LAB — RENAL FUNCTION PANEL
Albumin: 3.8 g/dL (ref 3.8–4.9)
BUN/Creatinine Ratio: 15 (ref 9–23)
BUN: 24 mg/dL (ref 6–24)
CO2: 22 mmol/L (ref 20–29)
Calcium: 9.4 mg/dL (ref 8.7–10.2)
Chloride: 108 mmol/L — ABNORMAL HIGH (ref 96–106)
Creatinine, Ser: 1.59 mg/dL — ABNORMAL HIGH (ref 0.57–1.00)
GFR calc Af Amer: 41 mL/min/{1.73_m2} — ABNORMAL LOW (ref >59)
GFR calc non Af Amer: 35 mL/min/{1.73_m2} — ABNORMAL LOW (ref >59)
Glucose: 157 mg/dL — ABNORMAL HIGH (ref 65–99)
Phosphorus: 2.7 mg/dL — ABNORMAL LOW (ref 3.0–4.3)
Potassium: 3.4 mmol/L — ABNORMAL LOW (ref 3.5–5.2)
Sodium: 144 mmol/L (ref 134–144)

## 2020-08-17 IMAGING — MG DIGITAL SCREENING BILATERAL MAMMOGRAM WITH TOMO AND CAD
6 of 10 series · 6 of 30 positions shown · non-contrast
Comparison: Previous exam(s).

CLINICAL DATA: Screening.

EXAM:
DIGITAL SCREENING BILATERAL MAMMOGRAM WITH TOMO AND CAD

[R CC synth-2D]
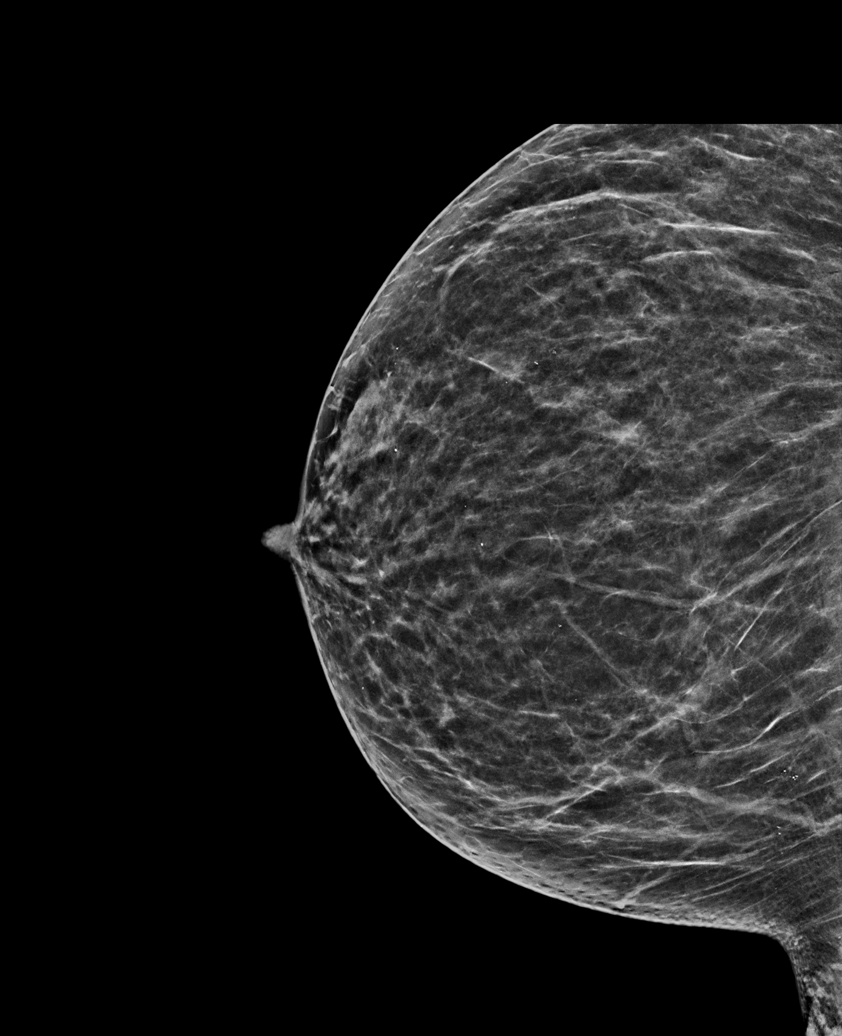

[L MLO synth-2D (1 of 2)]
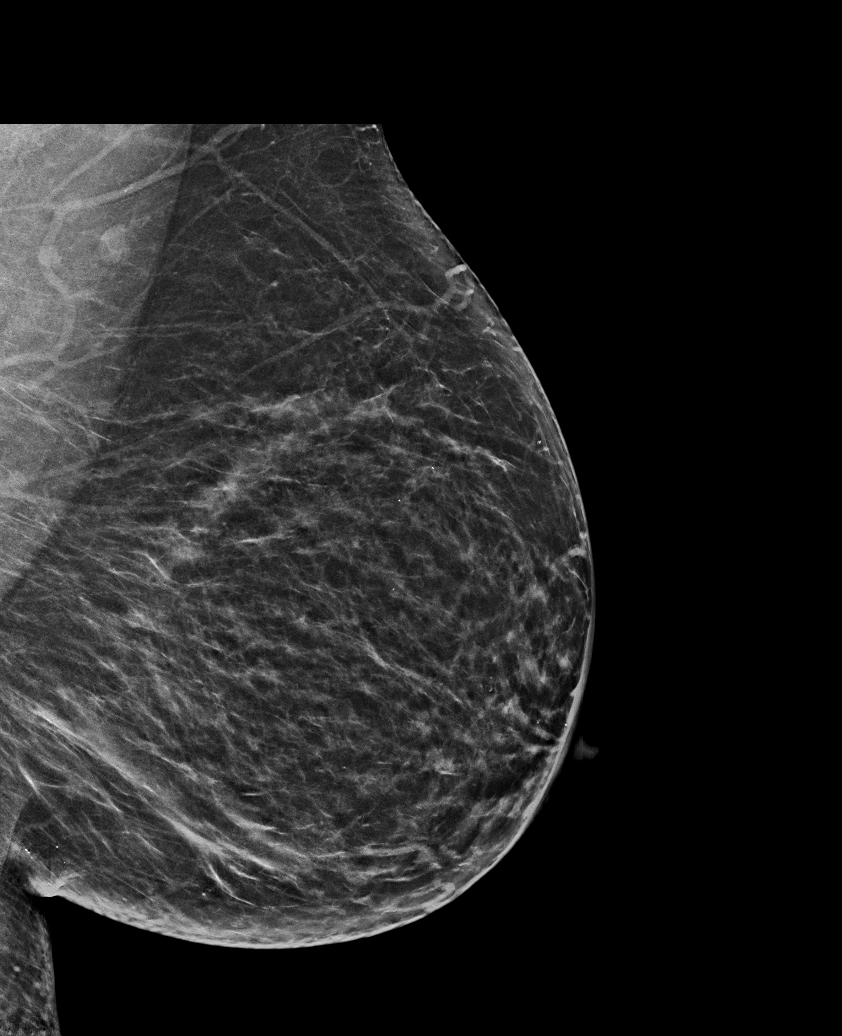

[L CC synth-2D]
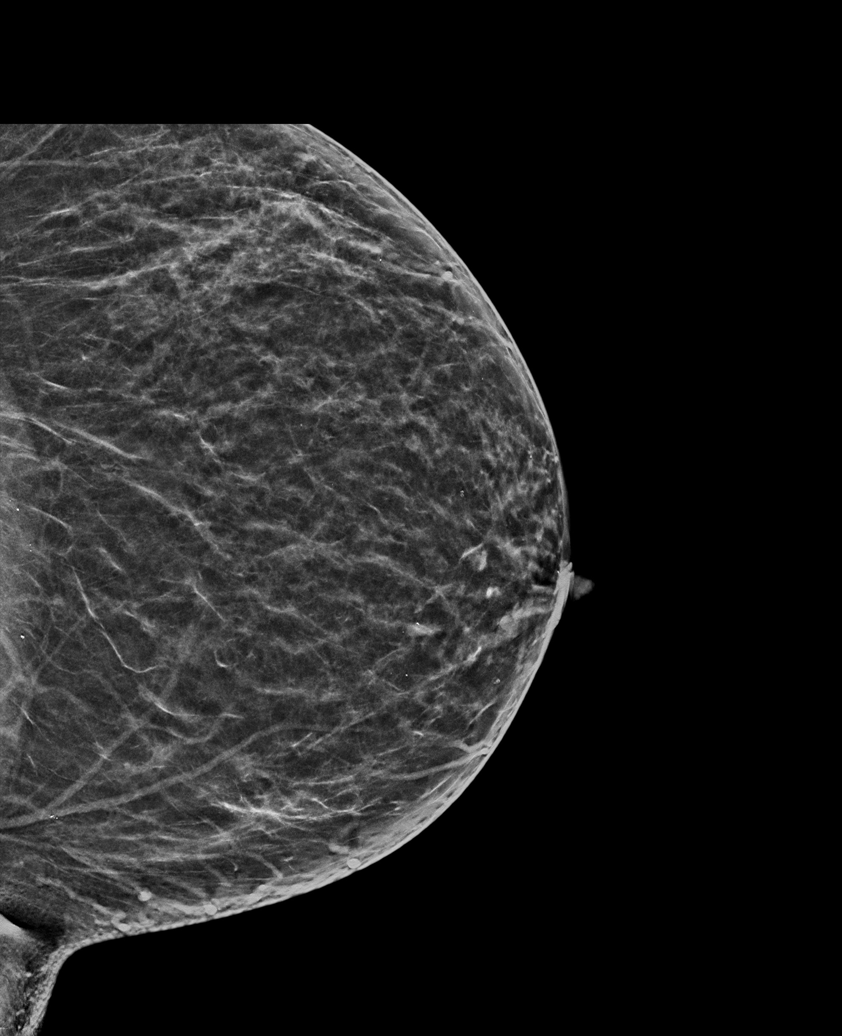

[L MLO synth-2D (2 of 2)]
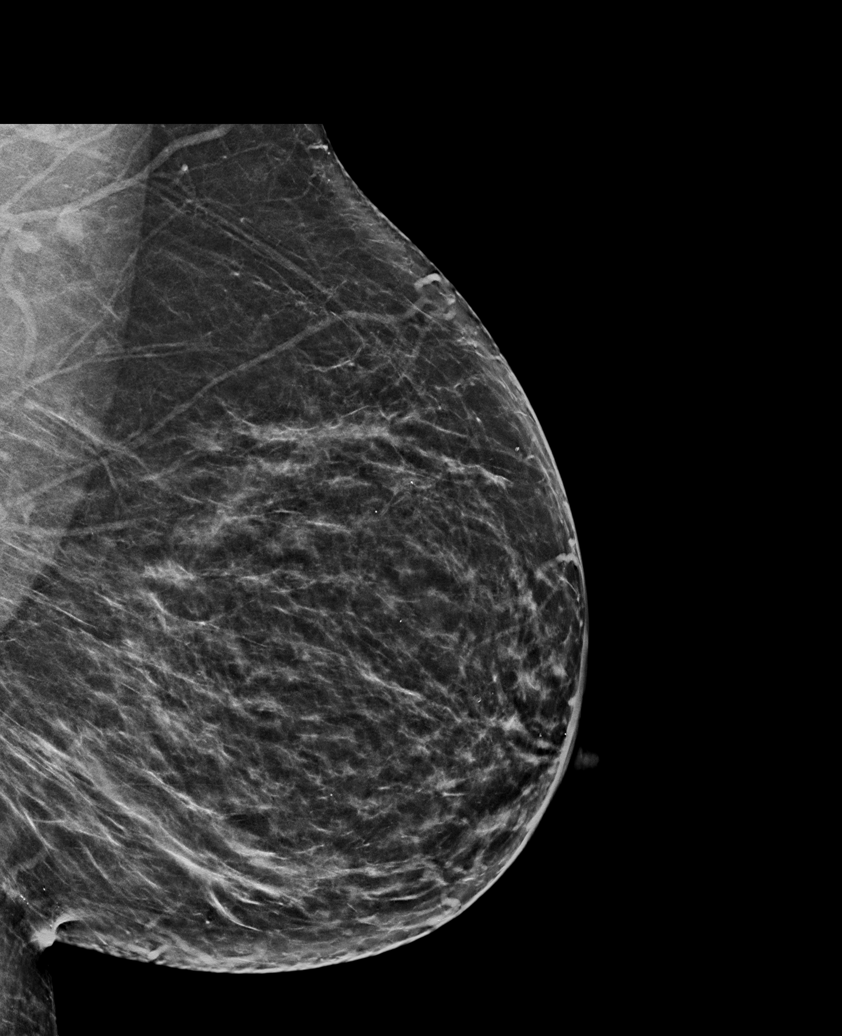

[R MLO synth-2D]
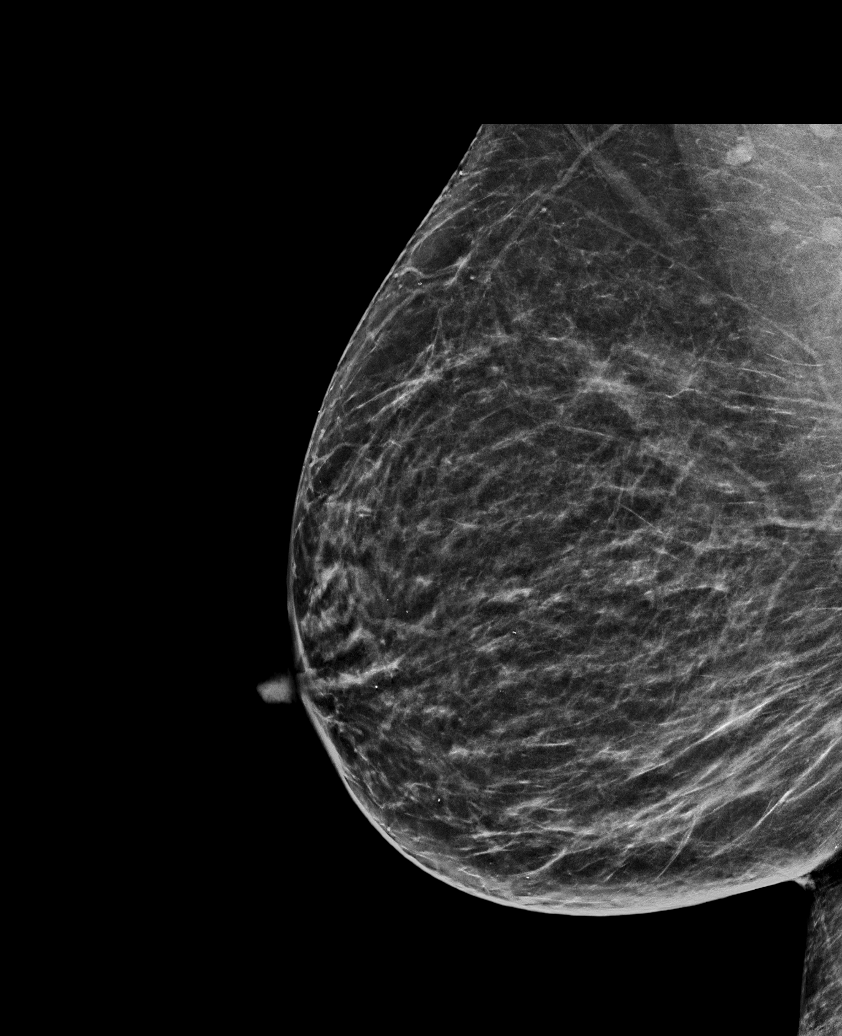

[L CC tomo · tomo slice 33/66.0]
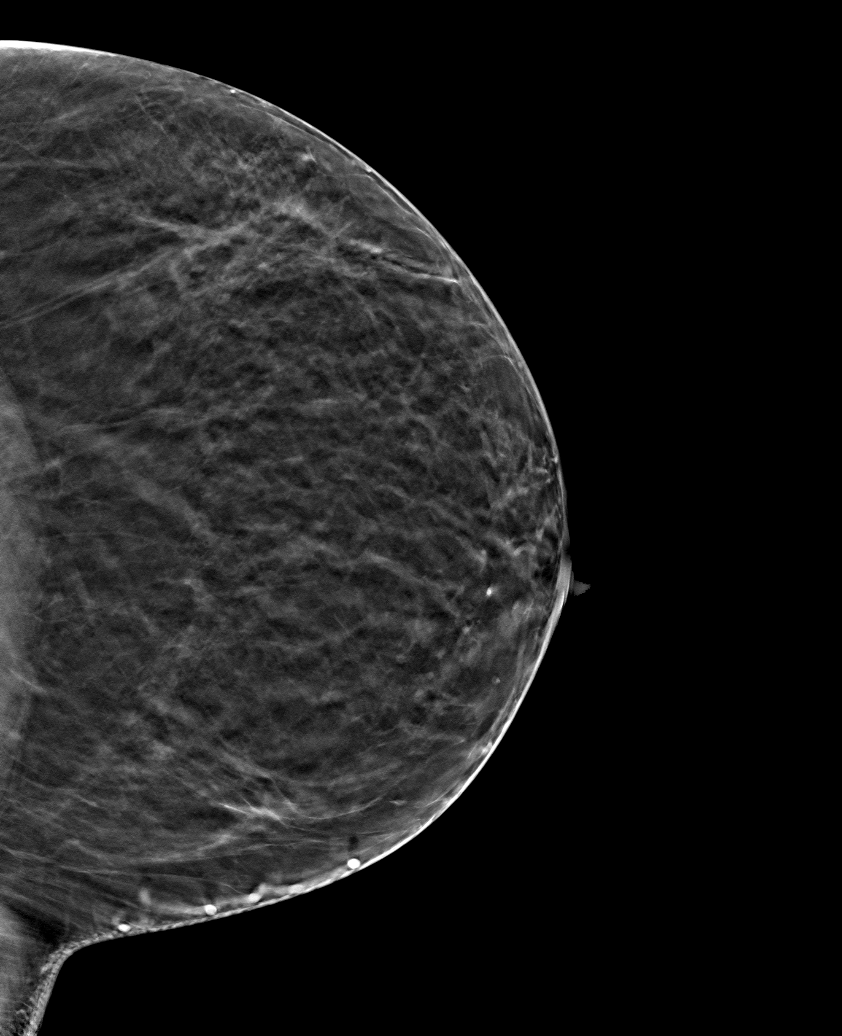

[6 of 30 positions shown; findings below may reference images not displayed]

ACR Breast Density Category c: The breast tissue is heterogeneously
dense, which may obscure small masses.
FINDINGS: There are no findings suspicious for malignancy. Images were
processed with CAD.
IMPRESSION: No mammographic evidence of malignancy. A result letter of this
screening mammogram will be mailed directly to the patient.

RECOMMENDATION:
Screening mammogram in one year. (Code:[id number])

BI-RADS CATEGORY  1: Negative.

## 2020-10-14 ENCOUNTER — Other Ambulatory Visit: Payer: Self-pay | Admitting: Family Medicine

## 2020-10-14 DIAGNOSIS — I10 Essential (primary) hypertension: Secondary | ICD-10-CM

## 2020-10-14 NOTE — Telephone Encounter (Signed)
Requested medication (s) are due for refill today: expired medications  Requested medication (s) are on the active medication list: yes  Last refill:  09/18/19 #90 1 refill  Future visit scheduled: no  Notes to clinic:  expired medications. Last labs 09/18/19 . Called patient to schedule appt. No answer, unable to leave voicemail, mailbox full. Do you want courtesy refill?     Requested Prescriptions  Pending Prescriptions Disp Refills   amLODipine (NORVASC) 10 MG tablet [Pharmacy Med Name: AMLODIPINE BESYLATE 10 MG TAB] 90 tablet 1    Sig: TAKE 1 TABLET BY MOUTH EVERY DAY     Cardiovascular:  Calcium Channel Blockers Failed - 10/14/2020  1:11 PM      Failed - Last BP in normal range    BP Readings from Last 1 Encounters:  09/18/19 (!) 146/98          Failed - Valid encounter within last 6 months    Recent Outpatient Visits           1 year ago Essential hypertension   [facility] Juline Patch, MD   1 year ago Essential hypertension   [facility] Juline Patch, MD   2 years ago Essential hypertension   [facility] Juline Patch, MD   2 years ago Annual physical exam   [facility] Juline Patch, MD   2 years ago Essential hypertension   [facility] Juline Patch, MD               hydrochlorothiazide (HYDRODIURIL) 25 MG tablet [Pharmacy Med Name: HYDROCHLOROTHIAZIDE 25 MG TAB] 90 tablet 1    Sig: TAKE 1 TABLET BY MOUTH DAILY ALONG WITH LOSARTAN DOSE     Cardiovascular: Diuretics - Thiazide Failed - 10/14/2020  1:11 PM      Failed - Ca in normal range and within 360 days    Calcium  Date Value Ref Range Status  09/18/2019 9.4 8.7 - 10.2 mg/dL Final          Failed - Cr in normal range and within 360 days    Creatinine, Ser  Date Value Ref Range Status  09/18/2019 1.59 (H) 0.57 - 1.00 mg/dL Final          Failed - K in normal range and within 360 days    Potassium  Date Value Ref Range Status   09/18/2019 3.4 (L) 3.5 - 5.2 mmol/L Final          Failed - Na in normal range and within 360 days    Sodium  Date Value Ref Range Status  09/18/2019 144 134 - 144 mmol/L Final          Failed - Last BP in normal range    BP Readings from Last 1 Encounters:  09/18/19 (!) 146/98          Failed - Valid encounter within last 6 months    Recent Outpatient Visits           1 year ago Essential hypertension   [place], Deanna C, MD   1 year ago Essential hypertension   [facility] Juline Patch, MD   2 years ago Essential hypertension   [facility] Juline Patch, MD   2 years ago Annual physical exam   [facility] Juline Patch, MD   2 years ago Essential hypertension   [facility] Juline Patch,  MD

## 2020-10-29 ENCOUNTER — Other Ambulatory Visit: Payer: Self-pay

## 2020-10-29 ENCOUNTER — Ambulatory Visit (INDEPENDENT_AMBULATORY_CARE_PROVIDER_SITE_OTHER): Payer: Federal, State, Local not specified - PPO | Admitting: Family Medicine

## 2020-10-29 ENCOUNTER — Encounter: Payer: Self-pay | Admitting: Family Medicine

## 2020-10-29 VITALS — BP 160/102 | HR 88 | Ht 68.0 in | Wt 170.0 lb

## 2020-10-29 DIAGNOSIS — Z23 Encounter for immunization: Secondary | ICD-10-CM | POA: Diagnosis not present

## 2020-10-29 DIAGNOSIS — Z6825 Body mass index (BMI) 25.0-25.9, adult: Secondary | ICD-10-CM | POA: Diagnosis not present

## 2020-10-29 DIAGNOSIS — I10 Essential (primary) hypertension: Secondary | ICD-10-CM | POA: Diagnosis not present

## 2020-10-29 DIAGNOSIS — E785 Hyperlipidemia, unspecified: Secondary | ICD-10-CM

## 2020-10-29 MED ORDER — LOSARTAN POTASSIUM 100 MG PO TABS: ORAL_TABLET | ORAL | 1 refills | Status: DC

## 2020-10-29 MED ORDER — HYDROCHLOROTHIAZIDE 25 MG PO TABS: ORAL_TABLET | ORAL | 1 refills | Status: DC

## 2020-10-29 MED ORDER — CARVEDILOL 3.125 MG PO TABS: 3.1250 mg | ORAL_TABLET | Freq: Two times a day (BID) | ORAL | 0 refills | Status: DC

## 2020-10-29 MED ORDER — AMLODIPINE BESYLATE 10 MG PO TABS: 10.0000 mg | ORAL_TABLET | Freq: Every day | ORAL | 1 refills | Status: DC

## 2020-10-29 NOTE — Patient Instructions (Signed)

## 2020-10-29 NOTE — Progress Notes (Addendum)
Date:  10/29/2020   Name:  Alyssa Hunt   DOB:  12-25-1960   MRN:  [id number]   Chief Complaint: Hypertension  Hypertension This is a chronic problem. The current episode started more than 1 year ago. The problem has been gradually worsening (off losarten) since onset. The problem is uncontrolled. Pertinent negatives include no anxiety, chest pain, headaches, malaise/fatigue, neck pain, orthopnea, palpitations, peripheral edema, PND, shortness of breath or sweats. Risk factors for coronary artery disease include family history. Past treatments include angiotensin blockers, diuretics, beta blockers and calcium channel blockers. The current treatment provides moderate improvement. Compliance problems include exercise.  There is no history of angina, kidney disease, CAD/MI, CVA, heart failure, left ventricular hypertrophy, PVD or retinopathy. There is no history of chronic renal disease, a hypertension causing med or renovascular disease.   Lab Results  Component Value Date   CREATININE 1.59 (H) 09/18/2019   BUN 24 09/18/2019   NA 144 09/18/2019   K 3.4 (L) 09/18/2019   CL 108 (H) 09/18/2019   CO2 22 09/18/2019   Lab Results  Component Value Date   CHOL 171 08/03/2018   HDL 73 08/03/2018   LDLCALC 84 08/03/2018   TRIG 71 08/03/2018   CHOLHDL 2.3 08/03/2018   No results found for: TSH No results found for: HGBA1C No results found for: WBC, HGB, HCT, MCV, PLT No results found for: ALT, AST, GGT, ALKPHOS, BILITOT   Review of Systems  Constitutional:  Negative for malaise/fatigue.  Respiratory:  Negative for shortness of breath.   Cardiovascular:  Negative for chest pain, palpitations, orthopnea and PND.  Musculoskeletal:  Negative for neck pain.  Neurological:  Negative for headaches.   Patient Active Problem List   Diagnosis Date Noted   Special screening for malignant neoplasms, colon    Benign neoplasm of cecum    Benign neoplasm of ascending colon    Benign  neoplasm of transverse colon    Essential hypertension 03/10/2015    No Known Allergies  Past Surgical History:  Procedure Laterality Date   CESAREAN SECTION  1998   COLONOSCOPY WITH PROPOFOL N/A 03/16/2018   Procedure: COLONOSCOPY WITH BIOPSIES;  Surgeon: Lucilla Lame, MD;  Location: [place];  Service: Endoscopy;  Laterality: N/A;   GASTRIC BYPASS     POLYPECTOMY N/A 03/16/2018   Procedure: POLYPECTOMY;  Surgeon: Lucilla Lame, MD;  Location: [place];  Service: Endoscopy;  Laterality: N/A;    Social History   Tobacco Use   Smoking status: Former    Types: Cigarettes    Quit date: 1997    Years since quitting: 25.7   Smokeless tobacco: Never  Vaping Use   Vaping Use: Never used  Substance Use Topics   Alcohol use: Yes    Alcohol/week: 0.0 standard drinks    Comment: may have 1-2 drinks/month   Drug use: No     Medication list has been reviewed and updated.  Current Meds  Medication Sig   amLODipine (NORVASC) 10 MG tablet Take 1 tablet (10 mg total) by mouth daily.   carvedilol (COREG) 3.125 MG tablet Take 1 tablet (3.125 mg total) by mouth 2 (two) times daily with a meal. Dr Nehemiah Massed   hydrochlorothiazide (HYDRODIURIL) 25 MG tablet One a day    PHQ 2/9 Scores 10/29/2020 09/18/2019 03/04/2019 08/03/2018  PHQ - 2 Score 0 0 0 0  PHQ- 9 Score 0 1 0 0    GAD 7 : Generalized Anxiety Score 10/29/2020 09/18/2019  03/04/2019  Nervous, Anxious, on Edge 0 1 0  Control/stop worrying 0 1 0  Worry too much - different things 1 1 0  Trouble relaxing 0 0 0  Restless 0 0 0  Easily annoyed or irritable 0 0 0  Afraid - awful might happen 0 0 0  Total GAD 7 Score 1 3 0  Anxiety Difficulty Not difficult at all Not difficult at all -    BP Readings from Last 3 Encounters:  09/18/19 (!) 146/98  03/04/19 130/88  08/03/18 138/88    Physical Exam  Wt Readings from Last 3 Encounters:  10/29/20 170 lb (77.1 kg)  09/18/19 185 lb (83.9 kg)  03/04/19 210 lb (95.3  kg)    Ht '5\' 8"'$  (1.727 m)   Wt 170 lb (77.1 kg)   BMI 25.85 kg/m   Assessment and Plan:  1. Essential hypertension Chronic.  Uncontrolled.  Stable.  Patient has been out of her losartan and has only been taking 3 of her medications.  Blood pressure today is 160/102.  Continue losartan 100 mg once a day, carvedilol 3.1251 tablet twice a day, hydrochlorothiazide 25 mg once a day, and amlodipine 10 mg once a day.  Patient will be seeing Dr. Jose Persia and has appointment for continuance of her Cardura although I have given her enough to get to the appointment and if blood pressure is not at goal I will be rechecking it and adjusting accordingly. - amLODipine (NORVASC) 10 MG tablet; Take 1 tablet (10 mg total) by mouth daily.  Dispense: 90 tablet; Refill: 1 - hydrochlorothiazide (HYDRODIURIL) 25 MG tablet; One a day  Dispense: 90 tablet; Refill: 1 - Renal Function Panel - carvedilol (COREG) 3.125 MG tablet; Take 1 tablet (3.125 mg total) by mouth 2 (two) times daily with a meal. Dr Nehemiah Massed  Dispense: 60 tablet; Refill: 0 - losartan (COZAAR) 100 MG tablet; TAKE 1 TABLET BY MOUTH EVERY DAY ALONG WITH HYDROCHLOROTHIAZIDE DOSE  Dispense: 90 tablet; Refill: 1  2. Hyperlipidemia, unspecified hyperlipidemia type Chronic.  Controlled by diet.  Stable.  Mild elevation of LDL in the past.  We will continue with lipid panel to evaluate and patient has been given low-cholesterol diet for weight loss as well. - Lipid Panel With LDL/HDL Ratio  3. BMI 25.0-25.9,adult Health risks of being over weight were discussed and patient was counseled on weight loss options and exercise.   4. Need for immunization against influenza Discussed and administered - Flu Vaccine QUAD 55moIM (Fluarix, Fluzone & Alfiuria Quad PF)

## 2020-10-30 LAB — LIPID PANEL WITH LDL/HDL RATIO
Cholesterol, Total: 183 mg/dL (ref 100–199)
HDL: 66 mg/dL (ref >39)
LDL Chol Calc (NIH): 101 mg/dL — ABNORMAL HIGH (ref 0–99)
LDL/HDL Ratio: 1.5 ratio (ref 0.0–3.2)
Triglycerides: 85 mg/dL (ref 0–149)
VLDL Cholesterol Cal: 16 mg/dL (ref 5–40)

## 2020-10-30 LAB — RENAL FUNCTION PANEL
Albumin: 3.9 g/dL (ref 3.8–4.9)
BUN/Creatinine Ratio: 21 (ref 12–28)
BUN: 29 mg/dL — ABNORMAL HIGH (ref 8–27)
CO2: 21 mmol/L (ref 20–29)
Calcium: 9.3 mg/dL (ref 8.7–10.3)
Chloride: 103 mmol/L (ref 96–106)
Creatinine, Ser: 1.41 mg/dL — ABNORMAL HIGH (ref 0.57–1.00)
Glucose: 103 mg/dL — ABNORMAL HIGH (ref 65–99)
Phosphorus: 4.2 mg/dL (ref 3.0–4.3)
Potassium: 4.4 mmol/L (ref 3.5–5.2)
Sodium: 140 mmol/L (ref 134–144)
eGFR: 43 mL/min/{1.73_m2} — ABNORMAL LOW (ref >59)

## 2020-11-20 ENCOUNTER — Other Ambulatory Visit: Payer: Self-pay | Admitting: Family Medicine

## 2020-11-20 DIAGNOSIS — I10 Essential (primary) hypertension: Secondary | ICD-10-CM

## 2020-12-02 ENCOUNTER — Ambulatory Visit (INDEPENDENT_AMBULATORY_CARE_PROVIDER_SITE_OTHER): Payer: Federal, State, Local not specified - PPO | Admitting: Family Medicine

## 2020-12-02 ENCOUNTER — Encounter: Payer: Self-pay | Admitting: Family Medicine

## 2020-12-02 ENCOUNTER — Other Ambulatory Visit: Payer: Self-pay

## 2020-12-02 VITALS — BP 122/82 | HR 72 | Ht 68.0 in | Wt 170.0 lb

## 2020-12-02 DIAGNOSIS — Z0000 Encounter for general adult medical examination without abnormal findings: Secondary | ICD-10-CM

## 2020-12-02 DIAGNOSIS — Z23 Encounter for immunization: Secondary | ICD-10-CM | POA: Diagnosis not present

## 2020-12-02 DIAGNOSIS — Z1231 Encounter for screening mammogram for malignant neoplasm of breast: Secondary | ICD-10-CM

## 2020-12-02 NOTE — Progress Notes (Signed)
Date:  12/02/2020   Name:  Alyssa Hunt   DOB:  1960-09-27   MRN:  [id number]   Chief Complaint: Annual Exam (No pap- needs breast exam) and shingles vaccine  Alyssa Hunt is a 60 y.o. female who presents today for her Complete Annual Exam. She feels well. She reports exercising "not really." She reports she is sleeping well.     Lab Results  Component Value Date   CREATININE 1.41 (H) 10/29/2020   BUN 29 (H) 10/29/2020   NA 140 10/29/2020   K 4.4 10/29/2020   CL 103 10/29/2020   CO2 21 10/29/2020   Lab Results  Component Value Date   CHOL 183 10/29/2020   HDL 66 10/29/2020   LDLCALC 101 (H) 10/29/2020   TRIG 85 10/29/2020   CHOLHDL 2.3 08/03/2018   No results found for: TSH No results found for: HGBA1C No results found for: WBC, HGB, HCT, MCV, PLT No results found for: ALT, AST, GGT, ALKPHOS, BILITOT   Review of Systems  Constitutional:  Negative for chills and fever.  HENT:  Negative for drooling, ear discharge, ear pain and sore throat.   Respiratory:  Negative for cough, shortness of breath and wheezing.   Cardiovascular:  Negative for chest pain, palpitations and leg swelling.  Gastrointestinal:  Negative for abdominal pain, blood in stool, constipation, diarrhea and nausea.  Endocrine: Negative for polydipsia.  Genitourinary:  Negative for dysuria, frequency, hematuria and urgency.  Musculoskeletal:  Negative for back pain, myalgias and neck pain.  Skin:  Negative for rash.  Allergic/Immunologic: Negative for environmental allergies.  Neurological:  Negative for dizziness and headaches.  Hematological:  Does not bruise/bleed easily.  Psychiatric/Behavioral:  Negative for suicidal ideas. The patient is not nervous/anxious.    Patient Active Problem List   Diagnosis Date Noted   Special screening for malignant neoplasms, colon    Benign neoplasm of cecum    Benign neoplasm of ascending colon    Benign neoplasm of transverse colon     Essential hypertension 03/10/2015    No Known Allergies  Past Surgical History:  Procedure Laterality Date   CESAREAN SECTION  1998   COLONOSCOPY WITH PROPOFOL N/A 03/16/2018   Procedure: COLONOSCOPY WITH BIOPSIES;  Surgeon: Lucilla Lame, MD;  Location: [place];  Service: Endoscopy;  Laterality: N/A;   GASTRIC BYPASS     POLYPECTOMY N/A 03/16/2018   Procedure: POLYPECTOMY;  Surgeon: Lucilla Lame, MD;  Location: [place];  Service: Endoscopy;  Laterality: N/A;    Social History   Tobacco Use   Smoking status: Former    Types: Cigarettes    Quit date: 1997    Years since quitting: 25.8   Smokeless tobacco: Never  Vaping Use   Vaping Use: Never used  Substance Use Topics   Alcohol use: Yes    Alcohol/week: 0.0 standard drinks    Comment: may have 1-2 drinks/month   Drug use: No     Medication list has been reviewed and updated.  Current Meds  Medication Sig   amLODipine (NORVASC) 10 MG tablet Take 1 tablet (10 mg total) by mouth daily.   carvedilol (COREG) 3.125 MG tablet TAKE 1 TABLET (3.125 MG TOTAL) BY MOUTH 2 (TWO) TIMES DAILY WITH A MEAL   hydrochlorothiazide (HYDRODIURIL) 25 MG tablet One a day   losartan (COZAAR) 100 MG tablet TAKE 1 TABLET BY MOUTH EVERY DAY ALONG WITH HYDROCHLOROTHIAZIDE DOSE    PHQ 2/9 Scores 10/29/2020 09/18/2019 03/04/2019 08/03/2018  PHQ - 2 Score 0 0 0 0  PHQ- 9 Score 0 1 0 0    GAD 7 : Generalized Anxiety Score 10/29/2020 09/18/2019 03/04/2019  Nervous, Anxious, on Edge 0 1 0  Control/stop worrying 0 1 0  Worry too much - different things 1 1 0  Trouble relaxing 0 0 0  Restless 0 0 0  Easily annoyed or irritable 0 0 0  Afraid - awful might happen 0 0 0  Total GAD 7 Score 1 3 0  Anxiety Difficulty Not difficult at all Not difficult at all -    BP Readings from Last 3 Encounters:  12/02/20 122/82  10/29/20 (!) 160/102  09/18/19 (!) 146/98    Physical Exam Vitals and nursing note reviewed.  Constitutional:       Appearance: She is well-developed, well-groomed and overweight.  HENT:     Head: Normocephalic.     Jaw: There is normal jaw occlusion.     Right Ear: Hearing, tympanic membrane, ear canal and external ear normal.     Left Ear: Hearing, tympanic membrane, ear canal and external ear normal.     Nose: Nose normal. No congestion or rhinorrhea.     Right Turbinates: Not swollen.     Left Turbinates: Not swollen.     Mouth/Throat:     Lips: Pink.     Mouth: Mucous membranes are moist. No oral lesions.     Dentition: Normal dentition.     Tongue: No lesions.     Palate: No mass.  Eyes:     General: Lids are normal. Vision grossly intact. Gaze aligned appropriately. No scleral icterus.       Left eye: No foreign body or hordeolum.     Extraocular Movements: Extraocular movements intact.     Conjunctiva/sclera: Conjunctivae normal.     Right eye: Right conjunctiva is not injected.     Left eye: Left conjunctiva is not injected.     Pupils: Pupils are equal, round, and reactive to light.     Funduscopic exam:    Right eye: Red reflex present.        Left eye: Red reflex present. Neck:     Thyroid: No thyroid mass, thyromegaly or thyroid tenderness.     Vascular: Normal carotid pulses. No carotid bruit, hepatojugular reflux or JVD.     Trachea: Trachea and phonation normal. No tracheal deviation.  Cardiovascular:     Rate and Rhythm: Normal rate and regular rhythm.     Pulses: Normal pulses.          Carotid pulses are 2+ on the right side and 2+ on the left side.      Radial pulses are 2+ on the right side and 2+ on the left side.       Femoral pulses are 2+ on the right side and 2+ on the left side.      Popliteal pulses are 2+ on the right side and 2+ on the left side.       Dorsalis pedis pulses are 2+ on the right side and 2+ on the left side.       Posterior tibial pulses are 2+ on the right side and 2+ on the left side.     Heart sounds: Normal heart sounds, S1 normal and S2  normal. No murmur heard. No systolic murmur is present.  No diastolic murmur is present.    No friction rub. No gallop. No S3 or S4 sounds.  Pulmonary:     Effort: Pulmonary effort is normal. No respiratory distress.     Breath sounds: Normal breath sounds. No decreased breath sounds, wheezing, rhonchi or rales.  Chest:  Breasts:    Breasts are symmetrical.     Right: Normal. No swelling, bleeding, inverted nipple, mass, nipple discharge, skin change or tenderness.     Left: Normal. No swelling, bleeding, inverted nipple, mass, nipple discharge, skin change or tenderness.  Abdominal:     General: Bowel sounds are normal.     Palpations: Abdomen is soft. There is no hepatomegaly, splenomegaly or mass.     Tenderness: There is no abdominal tenderness. There is no guarding or rebound.     Hernia: There is no hernia in the umbilical area, ventral area, left inguinal area or right inguinal area.  Genitourinary:    Rectum: Normal. Guaiac result negative. No mass.  Musculoskeletal:        General: No tenderness. Normal range of motion.     Cervical back: Full passive range of motion without pain, normal range of motion and neck supple.     Right lower leg: No edema.     Left lower leg: No edema.  Lymphadenopathy:     Head:     Right side of head: No submental, submandibular or tonsillar adenopathy.     Left side of head: No submental, submandibular or tonsillar adenopathy.     Cervical: No cervical adenopathy.     Right cervical: No superficial, deep or posterior cervical adenopathy.    Left cervical: No superficial, deep or posterior cervical adenopathy.     Upper Body:     Right upper body: No supraclavicular or axillary adenopathy.     Left upper body: No supraclavicular or axillary adenopathy.     Lower Body: No right inguinal adenopathy. No left inguinal adenopathy.  Skin:    General: Skin is warm.     Capillary Refill: Capillary refill takes less than 2 seconds.     Findings: No  rash.  Neurological:     Mental Status: She is alert and oriented to person, place, and time.     Cranial Nerves: Cranial nerves are intact. No cranial nerve deficit.     Sensory: Sensation is intact.     Motor: Motor function is intact.     Deep Tendon Reflexes: Reflexes normal.     Reflex Scores:      Tricep reflexes are 2+ on the right side and 2+ on the left side.      Bicep reflexes are 2+ on the right side and 2+ on the left side.      Brachioradialis reflexes are 2+ on the right side and 2+ on the left side.      Patellar reflexes are 2+ on the right side and 2+ on the left side.      Achilles reflexes are 2+ on the right side and 2+ on the left side. Psychiatric:        Mood and Affect: Mood is not anxious or depressed.        Behavior: Behavior is cooperative.    Wt Readings from Last 3 Encounters:  12/02/20 170 lb (77.1 kg)  10/29/20 170 lb (77.1 kg)  09/18/19 185 lb (83.9 kg)    BP 122/82   Pulse 72   Ht 5\' 8"  (1.727 m)   Wt 170 lb (77.1 kg)   BMI 25.85 kg/m   Assessment and Plan: Alyssa Hunt is a  60 y.o. female who presents today for her Complete Annual Exam. She feels fairly well. She reports exercising minimal. She reports she is sleeping fairly well.  Chart was reviewed for previous encounters most recent labs done in July of this year.  Most recent imaging in [place].  1. Annual physical exam Immunizations are reviewed and recommendations provided.   Age appropriate screening tests are discussed. Counseling given for risk factor reduction interventions.  No subjective/objective concerns noted during HPI/review of systems/physical exam.  2. Breast cancer screening by mammogram Discussed with patient and mammogram scheduled. - MM 3D SCREEN BREAST BILATERAL

## 2020-12-30 ENCOUNTER — Inpatient Hospital Stay: Admission: RE | Admit: 2020-12-30 | Payer: Federal, State, Local not specified - PPO | Source: Ambulatory Visit

## 2021-03-04 ENCOUNTER — Ambulatory Visit (INDEPENDENT_AMBULATORY_CARE_PROVIDER_SITE_OTHER): Payer: Federal, State, Local not specified - PPO

## 2021-03-04 ENCOUNTER — Other Ambulatory Visit: Payer: Self-pay

## 2021-03-04 DIAGNOSIS — Z23 Encounter for immunization: Secondary | ICD-10-CM

## 2021-03-04 NOTE — Progress Notes (Signed)
Shingrex vaccine.  KP

## 2021-04-02 ENCOUNTER — Ambulatory Visit: Payer: Federal, State, Local not specified - PPO | Admitting: Family Medicine

## 2021-04-06 ENCOUNTER — Ambulatory Visit: Payer: Federal, State, Local not specified - PPO | Admitting: Family Medicine

## 2021-04-07 ENCOUNTER — Ambulatory Visit: Payer: Federal, State, Local not specified - PPO | Admitting: Family Medicine

## 2021-04-07 ENCOUNTER — Other Ambulatory Visit: Payer: Self-pay

## 2021-04-07 ENCOUNTER — Encounter: Payer: Self-pay | Admitting: Family Medicine

## 2021-04-07 VITALS — BP 132/88 | HR 60 | Ht 68.0 in | Wt 162.0 lb

## 2021-04-07 DIAGNOSIS — E785 Hyperlipidemia, unspecified: Secondary | ICD-10-CM | POA: Diagnosis not present

## 2021-04-07 DIAGNOSIS — I340 Nonrheumatic mitral (valve) insufficiency: Secondary | ICD-10-CM

## 2021-04-07 DIAGNOSIS — I10 Essential (primary) hypertension: Secondary | ICD-10-CM

## 2021-04-07 DIAGNOSIS — B977 Papillomavirus as the cause of diseases classified elsewhere: Secondary | ICD-10-CM

## 2021-04-07 DIAGNOSIS — I517 Cardiomegaly: Secondary | ICD-10-CM

## 2021-04-07 DIAGNOSIS — I5022 Chronic systolic (congestive) heart failure: Secondary | ICD-10-CM

## 2021-04-07 MED ORDER — CARVEDILOL 3.125 MG PO TABS: 3.1250 mg | ORAL_TABLET | Freq: Two times a day (BID) | ORAL | 1 refills | Status: DC

## 2021-04-07 MED ORDER — AMLODIPINE BESYLATE 10 MG PO TABS: 10.0000 mg | ORAL_TABLET | Freq: Every day | ORAL | 1 refills | Status: DC

## 2021-04-07 MED ORDER — LOSARTAN POTASSIUM 100 MG PO TABS: ORAL_TABLET | ORAL | 1 refills | Status: DC

## 2021-04-07 MED ORDER — HYDROCHLOROTHIAZIDE 25 MG PO TABS: ORAL_TABLET | ORAL | 1 refills | Status: DC

## 2021-04-07 NOTE — Progress Notes (Signed)
Date:  04/07/2021   Name:  Alyssa Hunt   DOB:  [birth year]   MRN:  [id number]   Chief Complaint: Hypertension  Hypertension This is a chronic problem. The current episode started more than 1 year ago. The problem has been gradually improving since onset. The problem is controlled. Pertinent negatives include no blurred vision, chest pain, headaches, neck pain, orthopnea, palpitations, PND or shortness of breath. Past treatments include calcium channel blockers, beta blockers, alpha 1 blockers, diuretics and angiotensin blockers. The current treatment provides moderate improvement. There are no compliance problems.  There is no history of angina, kidney disease, CAD/MI, CVA, heart failure, left ventricular hypertrophy, PVD or retinopathy. There is no history of chronic renal disease, a hypertension causing med or renovascular disease.   Lab Results  Component Value Date   NA 140 10/29/2020   K 4.4 10/29/2020   CO2 21 10/29/2020   GLUCOSE 103 (H) 10/29/2020   BUN 29 (H) 10/29/2020   CREATININE 1.41 (H) 10/29/2020   CALCIUM 9.3 10/29/2020   EGFR 43 (L) 10/29/2020   GFRNONAA 35 (L) 09/18/2019   Lab Results  Component Value Date   CHOL 183 10/29/2020   HDL 66 10/29/2020   LDLCALC 101 (H) 10/29/2020   TRIG 85 10/29/2020   CHOLHDL 2.3 08/03/2018   No results found for: TSH No results found for: HGBA1C No results found for: WBC, HGB, HCT, MCV, PLT No results found for: ALT, AST, GGT, ALKPHOS, BILITOT No results found for: 25OHVITD2, 25OHVITD3, VD25OH   Review of Systems  Constitutional:  Negative for chills and fever.  HENT:  Negative for drooling, ear discharge, ear pain and sore throat.   Eyes:  Negative for blurred vision.  Respiratory:  Negative for cough, shortness of breath and wheezing.   Cardiovascular:  Negative for chest pain, palpitations, orthopnea, leg swelling and PND.  Gastrointestinal:  Negative for abdominal pain, blood in stool, constipation, diarrhea  and nausea.  Endocrine: Negative for polydipsia.  Genitourinary:  Negative for dysuria, frequency, hematuria and urgency.  Musculoskeletal:  Negative for back pain, myalgias and neck pain.  Skin:  Negative for rash.  Allergic/Immunologic: Negative for environmental allergies.  Neurological:  Negative for dizziness and headaches.  Hematological:  Does not bruise/bleed easily.  Psychiatric/Behavioral:  Negative for suicidal ideas. The patient is not nervous/anxious.    Patient Active Problem List   Diagnosis Date Noted   Special screening for malignant neoplasms, colon    Benign neoplasm of cecum    Benign neoplasm of ascending colon    Benign neoplasm of transverse colon    Essential hypertension 03/10/2015    No Known Allergies  Past Surgical History:  Procedure Laterality Date   CESAREAN SECTION  1998   COLONOSCOPY WITH PROPOFOL N/A 03/16/2018   Procedure: COLONOSCOPY WITH BIOPSIES;  Surgeon: Lucilla Lame, MD;  Location: [place];  Service: Endoscopy;  Laterality: N/A;   GASTRIC BYPASS     POLYPECTOMY N/A 03/16/2018   Procedure: POLYPECTOMY;  Surgeon: Lucilla Lame, MD;  Location: [place];  Service: Endoscopy;  Laterality: N/A;    Social History   Tobacco Use   Smoking status: Former    Types: Cigarettes    Quit date: 1997    Years since quitting: 26.1   Smokeless tobacco: Never  Vaping Use   Vaping Use: Never used  Substance Use Topics   Alcohol use: Yes    Alcohol/week: 0.0 standard drinks    Comment: may have 1-2 drinks/month  Drug use: No     Medication list has been reviewed and updated.  Current Meds  Medication Sig   amLODipine (NORVASC) 10 MG tablet Take 1 tablet (10 mg total) by mouth daily.   carvedilol (COREG) 3.125 MG tablet TAKE 1 TABLET (3.125 MG TOTAL) BY MOUTH 2 (TWO) TIMES DAILY WITH A MEAL   hydrochlorothiazide (HYDRODIURIL) 25 MG tablet One a day   losartan (COZAAR) 100 MG tablet TAKE 1 TABLET BY MOUTH EVERY DAY ALONG  WITH HYDROCHLOROTHIAZIDE DOSE    PHQ 2/9 Scores 04/07/2021 10/29/2020 09/18/2019 03/04/2019  PHQ - 2 Score 0 0 0 0  PHQ- 9 Score 0 0 1 0    GAD 7 : Generalized Anxiety Score 04/07/2021 10/29/2020 09/18/2019 03/04/2019  Nervous, Anxious, on Edge 0 0 1 0  Control/stop worrying 0 0 1 0  Worry too much - different things 0 1 1 0  Trouble relaxing 0 0 0 0  Restless 0 0 0 0  Easily annoyed or irritable 0 0 0 0  Afraid - awful might happen 0 0 0 0  Total GAD 7 Score 0 1 3 0  Anxiety Difficulty Not difficult at all Not difficult at all Not difficult at all -    BP Readings from Last 3 Encounters:  04/07/21 132/88  12/02/20 122/82  10/29/20 (!) 160/102    Physical Exam Vitals and nursing note reviewed.  Constitutional:      Appearance: She is well-developed.  HENT:     Head: Normocephalic.     Right Ear: Tympanic membrane and external ear normal. There is no impacted cerumen.     Left Ear: Tympanic membrane and external ear normal. There is no impacted cerumen.  Eyes:     General: Lids are everted, no foreign bodies appreciated. No scleral icterus.       Left eye: No foreign body or hordeolum.     Conjunctiva/sclera: Conjunctivae normal.     Right eye: Right conjunctiva is not injected.     Left eye: Left conjunctiva is not injected.     Pupils: Pupils are equal, round, and reactive to light.  Neck:     Thyroid: No thyromegaly.     Vascular: No JVD.     Trachea: No tracheal deviation.  Cardiovascular:     Rate and Rhythm: Normal rate and regular rhythm.     Pulses: Normal pulses.     Heart sounds: S1 normal and S2 normal. Murmur heard.  Systolic murmur is present with a grade of 2/6.  No diastolic murmur is present.    No friction rub. No gallop. No S3 or S4 sounds.  Pulmonary:     Effort: Pulmonary effort is normal. No respiratory distress.     Breath sounds: Normal breath sounds. No wheezing or rales.  Abdominal:     General: Bowel sounds are normal.     Palpations: Abdomen is  soft. There is no mass.     Tenderness: There is no abdominal tenderness. There is no guarding or rebound.  Musculoskeletal:        General: No tenderness. Normal range of motion.     Cervical back: Normal range of motion and neck supple.     Right lower leg: No edema.     Left lower leg: No edema.  Lymphadenopathy:     Cervical: No cervical adenopathy.  Skin:    General: Skin is warm.     Findings: No rash.  Neurological:     Mental Status:  She is alert and oriented to person, place, and time.     Cranial Nerves: No cranial nerve deficit.     Deep Tendon Reflexes: Reflexes normal.  Psychiatric:        Mood and Affect: Mood is not anxious or depressed.    Wt Readings from Last 3 Encounters:  04/07/21 162 lb (73.5 kg)  12/02/20 170 lb (77.1 kg)  10/29/20 170 lb (77.1 kg)    BP 132/88    Pulse 60    Ht '5\' 8"'  (1.727 m)    Wt 162 lb (73.5 kg)    BMI 24.63 kg/m   Assessment and Plan:  1. Essential hypertension Chronic.  Controlled.  Stable.  Blood pressure 132/88.  Continue amlodipine 10 mg once a day, carvedilol 3.1251 twice a day, hydrochlorothiazide 25 mg once a day and losartan 100 mg once a day.  Although blood pressure is acceptable on review patient does have mitral regurgitation which is relatively stable but also a history of LVH and systolic congestive heart failure.  As noted below this will be addressed by referral to cardiology for follow-up. - amLODipine (NORVASC) 10 MG tablet; Take 1 tablet (10 mg total) by mouth daily.  Dispense: 90 tablet; Refill: 1 - carvedilol (COREG) 3.125 MG tablet; Take 1 tablet (3.125 mg total) by mouth 2 (two) times daily with a meal.  Dispense: 180 tablet; Refill: 1 - hydrochlorothiazide (HYDRODIURIL) 25 MG tablet; One a day  Dispense: 90 tablet; Refill: 1 - losartan (COZAAR) 100 MG tablet; TAKE 1 TABLET BY MOUTH EVERY DAY ALONG WITH HYDROCHLOROTHIAZIDE DOSE  Dispense: 90 tablet; Refill: 1 - Renal Function Panel  2. Hyperlipidemia,  unspecified hyperlipidemia type Chronic.  Controlled.  Stable.  We will recheck lipid panel to see if in normal range and if not given her history of cardiomyopathy we will likely controlled with medication. - Lipid Panel With LDL/HDL Ratio  3. Mitral valve insufficiency, unspecified etiology Patient has a 2/6 systolic murmur heard best in the mitral area.  This has been documented by Dr. Jose Persia and will refer back for reevaluation.  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 SchermahornReferral has been made to gynecology for reevaluation. - Ambulatory referral to Gynecology  5. Chronic systolic CHF (congestive heart failure) (HCC) Patient with history of chronic systolic congestive heart failure has been followed by Dr. Mabeline Caras in the past and we will continue medications and refer to Dr. Jose Persia for reevaluation. - Ambulatory referral to Cardiology  6. LVH (left ventricular hypertrophy) As noted above has been followed by Dr. Stevphen [facility].  We will refer back for colonoscopy to have reevaluation seen that is been a couple of years and to make sure there is not been further progression. - Ambulatory referral to Cardiology   GFR has been decreased with elevated creatinine on the last visit.  Patient is dehydrated today has been encouraged to take fluids and is to return later this week for renal function panel lipid panel.  If GFR is still at risk we will contemplate seeing an nephrology.

## 2021-04-12 ENCOUNTER — Other Ambulatory Visit: Payer: Self-pay

## 2021-04-12 DIAGNOSIS — E785 Hyperlipidemia, unspecified: Secondary | ICD-10-CM | POA: Diagnosis not present

## 2021-04-12 DIAGNOSIS — I10 Essential (primary) hypertension: Secondary | ICD-10-CM | POA: Diagnosis not present

## 2021-04-12 NOTE — Progress Notes (Signed)
Printed labs

## 2021-04-13 ENCOUNTER — Other Ambulatory Visit: Payer: Self-pay

## 2021-04-13 DIAGNOSIS — I10 Essential (primary) hypertension: Secondary | ICD-10-CM

## 2021-04-13 DIAGNOSIS — R7989 Other specified abnormal findings of blood chemistry: Secondary | ICD-10-CM

## 2021-04-13 LAB — RENAL FUNCTION PANEL
Albumin: 4.3 g/dL (ref 3.8–4.8)
BUN/Creatinine Ratio: 25 (ref 12–28)
BUN: 39 mg/dL — ABNORMAL HIGH (ref 8–27)
CO2: 24 mmol/L (ref 20–29)
Calcium: 9.4 mg/dL (ref 8.7–10.3)
Chloride: 104 mmol/L (ref 96–106)
Creatinine, Ser: 1.59 mg/dL — ABNORMAL HIGH (ref 0.57–1.00)
Glucose: 103 mg/dL — ABNORMAL HIGH (ref 70–99)
Phosphorus: 4.4 mg/dL — ABNORMAL HIGH (ref 3.0–4.3)
Potassium: 4.1 mmol/L (ref 3.5–5.2)
Sodium: 145 mmol/L — ABNORMAL HIGH (ref 134–144)
eGFR: 37 mL/min/{1.73_m2} — ABNORMAL LOW (ref >59)

## 2021-04-13 LAB — LIPID PANEL WITH LDL/HDL RATIO
Cholesterol, Total: 198 mg/dL (ref 100–199)
HDL: 88 mg/dL (ref >39)
LDL Chol Calc (NIH): 95 mg/dL (ref 0–99)
LDL/HDL Ratio: 1.1 ratio (ref 0.0–3.2)
Triglycerides: 87 mg/dL (ref 0–149)
VLDL Cholesterol Cal: 15 mg/dL (ref 5–40)

## 2021-04-13 NOTE — Progress Notes (Signed)
Placed ref to [facility]

## 2021-04-20 DIAGNOSIS — Z8679 Personal history of other diseases of the circulatory system: Secondary | ICD-10-CM | POA: Diagnosis not present

## 2021-04-20 DIAGNOSIS — I10 Essential (primary) hypertension: Secondary | ICD-10-CM | POA: Diagnosis not present

## 2021-04-20 DIAGNOSIS — I5022 Chronic systolic (congestive) heart failure: Secondary | ICD-10-CM | POA: Diagnosis not present

## 2021-04-20 DIAGNOSIS — I110 Hypertensive heart disease with heart failure: Secondary | ICD-10-CM | POA: Diagnosis not present

## 2021-04-26 DIAGNOSIS — I5022 Chronic systolic (congestive) heart failure: Secondary | ICD-10-CM | POA: Diagnosis not present

## 2021-04-27 DIAGNOSIS — I5022 Chronic systolic (congestive) heart failure: Secondary | ICD-10-CM | POA: Diagnosis not present

## 2021-05-06 DIAGNOSIS — Z1231 Encounter for screening mammogram for malignant neoplasm of breast: Secondary | ICD-10-CM | POA: Diagnosis not present

## 2021-05-06 DIAGNOSIS — Z124 Encounter for screening for malignant neoplasm of cervix: Secondary | ICD-10-CM | POA: Diagnosis not present

## 2021-05-06 DIAGNOSIS — Z1211 Encounter for screening for malignant neoplasm of colon: Secondary | ICD-10-CM | POA: Diagnosis not present

## 2021-05-06 DIAGNOSIS — Z01411 Encounter for gynecological examination (general) (routine) with abnormal findings: Secondary | ICD-10-CM | POA: Diagnosis not present

## 2021-05-06 LAB — HM PAP SMEAR

## 2021-05-11 DIAGNOSIS — I5022 Chronic systolic (congestive) heart failure: Secondary | ICD-10-CM | POA: Diagnosis not present

## 2021-05-11 DIAGNOSIS — Z8679 Personal history of other diseases of the circulatory system: Secondary | ICD-10-CM | POA: Diagnosis not present

## 2021-05-11 DIAGNOSIS — I10 Essential (primary) hypertension: Secondary | ICD-10-CM | POA: Diagnosis not present

## 2021-05-11 DIAGNOSIS — I110 Hypertensive heart disease with heart failure: Secondary | ICD-10-CM | POA: Diagnosis not present

## 2021-05-19 DIAGNOSIS — D251 Intramural leiomyoma of uterus: Secondary | ICD-10-CM | POA: Diagnosis not present

## 2021-05-19 DIAGNOSIS — N852 Hypertrophy of uterus: Secondary | ICD-10-CM | POA: Diagnosis not present

## 2021-05-19 DIAGNOSIS — N83202 Unspecified ovarian cyst, left side: Secondary | ICD-10-CM | POA: Diagnosis not present

## 2021-05-27 ENCOUNTER — Other Ambulatory Visit: Payer: Self-pay | Admitting: Nephrology

## 2021-05-27 ENCOUNTER — Other Ambulatory Visit (HOSPITAL_COMMUNITY): Payer: Self-pay | Admitting: Nephrology

## 2021-05-27 DIAGNOSIS — I10 Essential (primary) hypertension: Secondary | ICD-10-CM | POA: Diagnosis not present

## 2021-05-27 DIAGNOSIS — N1832 Chronic kidney disease, stage 3b: Secondary | ICD-10-CM

## 2021-05-27 DIAGNOSIS — I5022 Chronic systolic (congestive) heart failure: Secondary | ICD-10-CM | POA: Diagnosis not present

## 2021-05-31 ENCOUNTER — Ambulatory Visit
Admission: RE | Admit: 2021-05-31 | Discharge: 2021-05-31 | Disposition: A | Discharge status: Home / Self Care | Payer: Federal, State, Local not specified - PPO | Source: Ambulatory Visit | Attending: Nephrology | Admitting: Nephrology

## 2021-05-31 DIAGNOSIS — N281 Cyst of kidney, acquired: Secondary | ICD-10-CM | POA: Diagnosis not present

## 2021-05-31 DIAGNOSIS — N1832 Chronic kidney disease, stage 3b: Secondary | ICD-10-CM | POA: Diagnosis not present

## 2021-05-31 DIAGNOSIS — N181 Chronic kidney disease, stage 1: Secondary | ICD-10-CM | POA: Diagnosis not present

## 2021-06-04 ENCOUNTER — Ambulatory Visit: Payer: Federal, State, Local not specified - PPO

## 2021-09-15 DIAGNOSIS — L574 Cutis laxa senilis: Secondary | ICD-10-CM | POA: Diagnosis not present

## 2021-09-15 DIAGNOSIS — E65 Localized adiposity: Secondary | ICD-10-CM | POA: Diagnosis not present

## 2021-09-15 DIAGNOSIS — R634 Abnormal weight loss: Secondary | ICD-10-CM | POA: Diagnosis not present

## 2021-10-05 ENCOUNTER — Encounter: Payer: Self-pay | Admitting: Family Medicine

## 2021-10-05 ENCOUNTER — Ambulatory Visit: Payer: Federal, State, Local not specified - PPO | Admitting: Family Medicine

## 2021-10-05 ENCOUNTER — Other Ambulatory Visit
Admission: RE | Admit: 2021-10-05 | Discharge: 2021-10-05 | Disposition: A | Discharge status: Home / Self Care | Payer: Federal, State, Local not specified - PPO | Attending: Family Medicine | Admitting: Family Medicine

## 2021-10-05 DIAGNOSIS — I10 Essential (primary) hypertension: Secondary | ICD-10-CM | POA: Diagnosis not present

## 2021-10-05 LAB — RENAL FUNCTION PANEL
Albumin: 3.9 g/dL (ref 3.5–5.0)
Anion gap: 7 (ref 5–15)
BUN: 37 mg/dL — ABNORMAL HIGH (ref 8–23)
CO2: 28 mmol/L (ref 22–32)
Calcium: 9.1 mg/dL (ref 8.9–10.3)
Chloride: 104 mmol/L (ref 98–111)
Creatinine, Ser: 1.65 mg/dL — ABNORMAL HIGH (ref 0.44–1.00)
GFR, Estimated: 35 mL/min — ABNORMAL LOW (ref >60)
Glucose, Bld: 111 mg/dL — ABNORMAL HIGH (ref 70–99)
Phosphorus: 3.1 mg/dL (ref 2.5–4.6)
Potassium: 3.3 mmol/L — ABNORMAL LOW (ref 3.5–5.1)
Sodium: 139 mmol/L (ref 135–145)

## 2021-10-05 MED ORDER — AMLODIPINE BESYLATE 10 MG PO TABS: 10.0000 mg | ORAL_TABLET | Freq: Every day | ORAL | 1 refills | Status: DC

## 2021-10-05 MED ORDER — LOSARTAN POTASSIUM 100 MG PO TABS: ORAL_TABLET | ORAL | 1 refills | Status: DC

## 2021-10-05 MED ORDER — CARVEDILOL 3.125 MG PO TABS: 3.1250 mg | ORAL_TABLET | Freq: Two times a day (BID) | ORAL | 1 refills | Status: DC

## 2021-10-05 MED ORDER — HYDROCHLOROTHIAZIDE 25 MG PO TABS: ORAL_TABLET | ORAL | 1 refills | Status: DC

## 2021-10-05 NOTE — Progress Notes (Signed)
Date:  10/05/2021   Name:  Alyssa Hunt   DOB:  [birth year]   MRN:  [id number]   Chief Complaint: Hypertension  Hypertension This is a chronic problem. The current episode started more than 1 year ago. The problem has been waxing and waning since onset. The problem is uncontrolled. Pertinent negatives include no blurred vision, chest pain, headaches, neck pain, orthopnea, palpitations, peripheral edema, PND or shortness of breath. Agents associated with hypertension include NSAIDs (pt stopped advil). Risk factors for coronary artery disease include dyslipidemia. Past treatments include beta blockers, alpha 1 blockers, diuretics, angiotensin blockers and calcium channel blockers. The current treatment provides moderate improvement. There are no compliance problems.  Hypertensive end-organ damage includes kidney disease. There is no history of angina, CAD/MI, CVA, heart failure, left ventricular hypertrophy, PVD or retinopathy. Identifiable causes of hypertension include chronic renal disease. There is no history of a hypertension causing med or renovascular disease.    Lab Results  Component Value Date   NA 145 (H) 04/12/2021   K 4.1 04/12/2021   CO2 24 04/12/2021   GLUCOSE 103 (H) 04/12/2021   BUN 39 (H) 04/12/2021   CREATININE 1.59 (H) 04/12/2021   CALCIUM 9.4 04/12/2021   EGFR 37 (L) 04/12/2021   GFRNONAA 35 (L) 09/18/2019   Lab Results  Component Value Date   CHOL 198 04/12/2021   HDL 88 04/12/2021   LDLCALC 95 04/12/2021   TRIG 87 04/12/2021   CHOLHDL 2.3 08/03/2018   No results found for: "TSH" No results found for: "HGBA1C" No results found for: "WBC", "HGB", "HCT", "MCV", "PLT" No results found for: "ALT", "AST", "GGT", "ALKPHOS", "BILITOT" No results found for: "25OHVITD2", "25OHVITD3", "VD25OH"   Review of Systems  Constitutional:  Negative for chills and fever.  HENT:  Negative for drooling, ear discharge, ear pain and sore throat.   Eyes:  Negative for  blurred vision.  Respiratory:  Negative for cough, shortness of breath and wheezing.   Cardiovascular:  Negative for chest pain, palpitations, orthopnea, leg swelling and PND.  Gastrointestinal:  Negative for abdominal pain, blood in stool, constipation, diarrhea and nausea.  Endocrine: Negative for polydipsia.  Genitourinary:  Negative for dysuria, frequency, hematuria and urgency.  Musculoskeletal:  Negative for back pain, myalgias and neck pain.  Skin:  Negative for rash.  Allergic/Immunologic: Negative for environmental allergies.  Neurological:  Negative for dizziness and headaches.  Hematological:  Does not bruise/bleed easily.  Psychiatric/Behavioral:  Negative for suicidal ideas. The patient is not nervous/anxious.     Patient Active Problem List   Diagnosis Date Noted   Special screening for malignant neoplasms, colon    Benign neoplasm of cecum    Benign neoplasm of ascending colon    Benign neoplasm of transverse colon    Essential hypertension 03/10/2015    No Known Allergies  Past Surgical History:  Procedure Laterality Date   CESAREAN SECTION  1998   COLONOSCOPY WITH PROPOFOL N/A 03/16/2018   Procedure: COLONOSCOPY WITH BIOPSIES;  Surgeon: Lucilla Lame, MD;  Location: [place];  Service: Endoscopy;  Laterality: N/A;   GASTRIC BYPASS     POLYPECTOMY N/A 03/16/2018   Procedure: POLYPECTOMY;  Surgeon: Lucilla Lame, MD;  Location: [place];  Service: Endoscopy;  Laterality: N/A;    Social History   Tobacco Use   Smoking status: Former    Types: Cigarettes    Quit date: 1997    Years since quitting: 26.6   Smokeless tobacco: Never  Vaping  Use   Vaping Use: Never used  Substance Use Topics   Alcohol use: Yes    Alcohol/week: 0.0 standard drinks of alcohol    Comment: may have 1-2 drinks/month   Drug use: No     Medication list has been reviewed and updated.  Current Meds  Medication Sig   amLODipine (NORVASC) 10 MG tablet Take 1  tablet (10 mg total) by mouth daily.   carvedilol (COREG) 3.125 MG tablet Take 1 tablet (3.125 mg total) by mouth 2 (two) times daily with a meal.   hydrochlorothiazide (HYDRODIURIL) 25 MG tablet One a day   losartan (COZAAR) 100 MG tablet TAKE 1 TABLET BY MOUTH EVERY DAY ALONG WITH HYDROCHLOROTHIAZIDE DOSE       10/05/2021   10:18 AM 04/07/2021   10:25 AM 10/29/2020    8:42 AM 09/18/2019   10:20 AM  GAD 7 : Generalized Anxiety Score  Nervous, Anxious, on Edge 0 0 0 1  Control/stop worrying 0 0 0 1  Worry too much - different things 0 0 1 1  Trouble relaxing 0 0 0 0  Restless 0 0 0 0  Easily annoyed or irritable 0 0 0 0  Afraid - awful might happen 0 0 0 0  Total GAD 7 Score 0 0 1 3  Anxiety Difficulty Not difficult at all Not difficult at all Not difficult at all Not difficult at all       10/05/2021   10:18 AM 04/07/2021   10:25 AM 10/29/2020    8:41 AM  Depression screen PHQ 2/9  Decreased Interest 0 0 0  Down, Depressed, Hopeless 0 0 0  PHQ - 2 Score 0 0 0  Altered sleeping 0 0 0  Tired, decreased energy 0 0 0  Change in appetite 0 0 0  Feeling bad or failure about yourself  0 0 0  Trouble concentrating 0 0 0  Moving slowly or fidgety/restless 0 0 0  Suicidal thoughts 0 0 0  PHQ-9 Score 0 0 0  Difficult doing work/chores Not difficult at all Not difficult at all     BP Readings from Last 3 Encounters:  10/05/21 (!) 142/94  04/07/21 132/88  12/02/20 122/82    Physical Exam Vitals and nursing note reviewed. Exam conducted with a chaperone present.  Constitutional:      General: She is not in acute distress.    Appearance: She is not diaphoretic.  HENT:     Head: Normocephalic and atraumatic.     Right Ear: Tympanic membrane and external ear normal.     Left Ear: Tympanic membrane and external ear normal.     Nose: Nose normal.     Mouth/Throat:     Mouth: Mucous membranes are moist.  Eyes:     General:        Right eye: No discharge.        Left eye: No  discharge.     Conjunctiva/sclera: Conjunctivae normal.     Pupils: Pupils are equal, round, and reactive to light.  Neck:     Thyroid: No thyromegaly.     Vascular: No JVD.  Cardiovascular:     Rate and Rhythm: Normal rate and regular rhythm.     Chest Wall: PMI is not displaced.     Pulses: Normal pulses.     Heart sounds: Normal heart sounds, S1 normal and S2 normal. No murmur heard.    No systolic murmur is present.     No  diastolic murmur is present.     No friction rub. No gallop. No S3 or S4 sounds.  Pulmonary:     Effort: Pulmonary effort is normal.     Breath sounds: Normal breath sounds. No decreased breath sounds, wheezing, rhonchi or rales.  Abdominal:     General: Bowel sounds are normal.     Palpations: Abdomen is soft. There is no mass.     Tenderness: There is no abdominal tenderness. There is no guarding.  Musculoskeletal:        General: Normal range of motion.     Cervical back: Normal range of motion and neck supple.     Right lower leg: No edema.     Left lower leg: No edema.  Lymphadenopathy:     Cervical: No cervical adenopathy.  Skin:    General: Skin is warm and dry.  Neurological:     Mental Status: She is alert.     Deep Tendon Reflexes:     Reflex Scores:      Patellar reflexes are 1+ on the right side and 1+ on the left side.    Wt Readings from Last 3 Encounters:  10/05/21 162 lb (73.5 kg)  04/07/21 162 lb (73.5 kg)  12/02/20 170 lb (77.1 kg)    BP (!) 142/94 (BP Location: Left Arm, Cuff Size: Large)   Pulse 80   Ht _0  (1.727 m)   Wt 162 lb (73.5 kg)   BMI 24.63 kg/m   Assessment and Plan:  1. Essential hypertension Chronic.  Uncontrolled.  Stable.  Blood pressure 142/94.  2 concerns is that patient has only been taking her carvedilol once a day and that we have encouraged her to go back to twice a day although that this given the half-life should not of affected her blood pressure adversely if she is taking it once a day this  morning.  However there was some discussion with her nephrologist about initiating Wilder Glade and she got the impression with these labs were okay that she did not need to go back and upon calling the understanding was that the patient was to return for the possibility of 1 mg this medication for her kidneys.  This also bring her blood pressure down as well and therefore we will proceed with the appointment that has been confirmed that nephrology would like to see urine patient is to call.  In the meantime we will check her renal function panel for current electrolytes and GFR status - amLODipine (NORVASC) 10 MG tablet; Take 1 tablet (10 mg total) by mouth daily.  Dispense: 90 tablet; Refill: 1 - carvedilol (COREG) 3.125 MG tablet; Take 1 tablet (3.125 mg total) by mouth 2 (two) times daily with a meal.  Dispense: 180 tablet; Refill: 1 - hydrochlorothiazide (HYDRODIURIL) 25 MG tablet; One a day  Dispense: 90 tablet; Refill: 1 - losartan (COZAAR) 100 MG tablet; TAKE 1 TABLET BY MOUTH EVERY DAY ALONG WITH HYDROCHLOROTHIAZIDE DOSE  Dispense: 90 tablet; Refill: 1 - Comprehensive Metabolic Panel (CMET)    Otilio Miu, MD

## 2021-11-11 DIAGNOSIS — R809 Proteinuria, unspecified: Secondary | ICD-10-CM | POA: Diagnosis not present

## 2021-11-11 DIAGNOSIS — I5022 Chronic systolic (congestive) heart failure: Secondary | ICD-10-CM | POA: Diagnosis not present

## 2021-11-11 DIAGNOSIS — I10 Essential (primary) hypertension: Secondary | ICD-10-CM | POA: Diagnosis not present

## 2021-11-11 DIAGNOSIS — N1832 Chronic kidney disease, stage 3b: Secondary | ICD-10-CM | POA: Diagnosis not present

## 2022-01-24 ENCOUNTER — Ambulatory Visit: Payer: Federal, State, Local not specified - PPO | Admitting: Family Medicine

## 2022-02-24 ENCOUNTER — Ambulatory Visit: Payer: Federal, State, Local not specified - PPO | Admitting: Family Medicine

## 2022-02-24 ENCOUNTER — Encounter: Payer: Self-pay | Admitting: Family Medicine

## 2022-02-24 ENCOUNTER — Ambulatory Visit
Admission: RE | Admit: 2022-02-24 | Discharge: 2022-02-24 | Disposition: A | Discharge status: Home / Self Care | Payer: Federal, State, Local not specified - PPO | Source: Ambulatory Visit | Attending: Family Medicine | Admitting: Family Medicine

## 2022-02-24 ENCOUNTER — Ambulatory Visit
Admission: RE | Admit: 2022-02-24 | Discharge: 2022-02-24 | Disposition: A | Discharge status: Home / Self Care | Payer: Federal, State, Local not specified - PPO | Attending: Family Medicine | Admitting: Family Medicine

## 2022-02-24 VITALS — BP 160/102 | HR 88 | Ht 68.0 in | Wt 164.0 lb

## 2022-02-24 DIAGNOSIS — R052 Subacute cough: Secondary | ICD-10-CM | POA: Insufficient documentation

## 2022-02-24 DIAGNOSIS — R059 Cough, unspecified: Secondary | ICD-10-CM | POA: Diagnosis not present

## 2022-02-24 DIAGNOSIS — I517 Cardiomegaly: Secondary | ICD-10-CM | POA: Diagnosis not present

## 2022-02-24 DIAGNOSIS — J0191 Acute recurrent sinusitis, unspecified: Secondary | ICD-10-CM | POA: Diagnosis not present

## 2022-02-24 DIAGNOSIS — I10 Essential (primary) hypertension: Secondary | ICD-10-CM | POA: Diagnosis not present

## 2022-02-24 DIAGNOSIS — Z111 Encounter for screening for respiratory tuberculosis: Secondary | ICD-10-CM

## 2022-02-24 DIAGNOSIS — Z021 Encounter for pre-employment examination: Secondary | ICD-10-CM

## 2022-02-24 DIAGNOSIS — I700 Atherosclerosis of aorta: Secondary | ICD-10-CM | POA: Diagnosis not present

## 2022-02-24 LAB — POC COVID19 BINAXNOW: SARS Coronavirus 2 Ag: NEGATIVE

## 2022-02-24 MED ORDER — CARVEDILOL 6.25 MG PO TABS: 6.2500 mg | ORAL_TABLET | Freq: Two times a day (BID) | ORAL | 1 refills | Status: DC

## 2022-02-24 MED ORDER — BENZONATATE 100 MG PO CAPS: 100.0000 mg | ORAL_CAPSULE | Freq: Three times a day (TID) | ORAL | 0 refills | Status: DC | PRN

## 2022-02-24 MED ORDER — AZITHROMYCIN 250 MG PO TABS: ORAL_TABLET | ORAL | 0 refills | Status: AC

## 2022-02-24 NOTE — Progress Notes (Signed)
Date:  02/24/2022   Name:  Alyssa Hunt   DOB:  1960-04-14   MRN:  [id number]   Chief Complaint: Cough (Clear production, cough syrup helps never goes away) and teacher form  Cough This is a new problem. The current episode started 1 to 4 weeks ago (2.5 weeks). The problem has been waxing and waning. The cough is Productive of purulent sputum. Associated symptoms include nasal congestion, postnasal drip, rhinorrhea, shortness of breath and wheezing. Pertinent negatives include no chest pain, chills, ear congestion, ear pain, fever, headaches or sore throat. Associated symptoms comments: Inability to take deep inspiration. The symptoms are aggravated by lying down.  Sinusitis This is a recurrent problem. The current episode started 1 to 4 weeks ago. There has been no fever. Associated symptoms include congestion, coughing, shortness of breath and sinus pressure. Pertinent negatives include no chills, ear pain, headaches, sneezing, sore throat or swollen glands.  Hypertension This is a chronic problem. The current episode started more than 1 year ago. The problem has been waxing and waning since onset. The problem is uncontrolled. Associated symptoms include shortness of breath. Pertinent negatives include no chest pain, headaches or palpitations. Past treatments include angiotensin blockers, calcium channel blockers, beta blockers, alpha 1 blockers and diuretics. The current treatment provides moderate improvement. There are no compliance problems.  Hypertensive end-organ damage includes kidney disease and heart failure. There is no history of angina, CAD/MI, CVA, left ventricular hypertrophy, PVD or retinopathy.    Lab Results  Component Value Date   NA 139 10/05/2021   K 3.3 (L) 10/05/2021   CO2 28 10/05/2021   GLUCOSE 111 (H) 10/05/2021   BUN 37 (H) 10/05/2021   CREATININE 1.65 (H) 10/05/2021   CALCIUM 9.1 10/05/2021   EGFR 37 (L) 04/12/2021   GFRNONAA 35 (L) 10/05/2021    Lab Results  Component Value Date   CHOL 198 04/12/2021   HDL 88 04/12/2021   LDLCALC 95 04/12/2021   TRIG 87 04/12/2021   CHOLHDL 2.3 08/03/2018   No results found for: "TSH" No results found for: "HGBA1C" No results found for: "WBC", "HGB", "HCT", "MCV", "PLT" No results found for: "ALT", "AST", "GGT", "ALKPHOS", "BILITOT" No results found for: "25OHVITD2", "25OHVITD3", "VD25OH"   Review of Systems  Constitutional:  Negative for chills and fever.  HENT:  Positive for congestion, postnasal drip, rhinorrhea and sinus pressure. Negative for ear pain, sneezing and sore throat.   Respiratory:  Positive for cough, shortness of breath and wheezing. Negative for chest tightness.   Cardiovascular:  Negative for chest pain and palpitations.  Neurological:  Negative for headaches.    Patient Active Problem List   Diagnosis Date Noted   Special screening for malignant neoplasms, colon    Benign neoplasm of cecum    Benign neoplasm of ascending colon    Benign neoplasm of transverse colon    Essential hypertension 03/10/2015    No Known Allergies  Past Surgical History:  Procedure Laterality Date   CESAREAN SECTION  1998   COLONOSCOPY WITH PROPOFOL N/A 03/16/2018   Procedure: COLONOSCOPY WITH BIOPSIES;  Surgeon: Lucilla Lame, MD;  Location: [facility];  Service: Endoscopy;  Laterality: N/A;   GASTRIC BYPASS     POLYPECTOMY N/A 03/16/2018   Procedure: POLYPECTOMY;  Surgeon: Lucilla Lame, MD;  Location: [facility];  Service: Endoscopy;  Laterality: N/A;    Social History   Tobacco Use   Smoking status: Former    Types: Cigarettes  Quit date: 66    Years since quitting: 27.0   Smokeless tobacco: Never  Vaping Use   Vaping Use: Never used  Substance Use Topics   Alcohol use: Yes    Alcohol/week: 0.0 standard drinks of alcohol    Comment: may have 1-2 drinks/month   Drug use: No     Medication list has been reviewed and updated.  Current Meds   Medication Sig   amLODipine (NORVASC) 10 MG tablet Take 1 tablet (10 mg total) by mouth daily.   carvedilol (COREG) 3.125 MG tablet Take 1 tablet (3.125 mg total) by mouth 2 (two) times daily with a meal.   hydrochlorothiazide (HYDRODIURIL) 25 MG tablet One a day   losartan (COZAAR) 100 MG tablet TAKE 1 TABLET BY MOUTH EVERY DAY ALONG WITH HYDROCHLOROTHIAZIDE DOSE       02/24/2022    9:11 AM 10/05/2021   10:18 AM 04/07/2021   10:25 AM 10/29/2020    8:42 AM  GAD 7 : Generalized Anxiety Score  Nervous, Anxious, on Edge 0 0 0 0  Control/stop worrying 0 0 0 0  Worry too much - different things 0 0 0 1  Trouble relaxing 0 0 0 0  Restless 0 0 0 0  Easily annoyed or irritable 0 0 0 0  Afraid - awful might happen 0 0 0 0  Total GAD 7 Score 0 0 0 1  Anxiety Difficulty Not difficult at all Not difficult at all Not difficult at all Not difficult at all       02/24/2022    9:10 AM 10/05/2021   10:18 AM 04/07/2021   10:25 AM  Depression screen PHQ 2/9  Decreased Interest 0 0 0  Down, Depressed, Hopeless 0 0 0  PHQ - 2 Score 0 0 0  Altered sleeping 0 0 0  Tired, decreased energy 0 0 0  Change in appetite 0 0 0  Feeling bad or failure about yourself  0 0 0  Trouble concentrating 0 0 0  Moving slowly or fidgety/restless 0 0 0  Suicidal thoughts 0 0 0  PHQ-9 Score 0 0 0  Difficult doing work/chores Not difficult at all Not difficult at all Not difficult at all    BP Readings from Last 3 Encounters:  02/24/22 (!) 160/102  10/05/21 (!) 142/94  04/07/21 132/88    Physical Exam Vitals and nursing note reviewed. Exam conducted with a chaperone present.  Constitutional:      General: She is not in acute distress.    Appearance: She is not diaphoretic.  HENT:     Head: Normocephalic and atraumatic.     Right Ear: Tympanic membrane, ear canal and external ear normal.     Left Ear: Tympanic membrane, ear canal and external ear normal.     Nose: Nose normal.     Right Turbinates:  Swollen.     Left Turbinates: Swollen.     Right Sinus: No maxillary sinus tenderness or frontal sinus tenderness.     Left Sinus: No maxillary sinus tenderness or frontal sinus tenderness.     Mouth/Throat:     Mouth: Mucous membranes are moist.     Pharynx: No posterior oropharyngeal erythema.  Eyes:     General:        Right eye: No discharge.        Left eye: No discharge.     Conjunctiva/sclera: Conjunctivae normal.     Pupils: Pupils are equal, round, and reactive to light.  Neck:     Thyroid: No thyromegaly.     Vascular: No JVD.  Cardiovascular:     Rate and Rhythm: Normal rate and regular rhythm.     Heart sounds: Normal heart sounds. No murmur heard.    No friction rub. No gallop.  Pulmonary:     Effort: Pulmonary effort is normal.     Breath sounds: Normal breath sounds.  Abdominal:     General: Bowel sounds are normal.     Palpations: Abdomen is soft. There is no mass.     Tenderness: There is no abdominal tenderness. There is no guarding.  Musculoskeletal:        General: Normal range of motion.     Cervical back: Normal range of motion and neck supple.  Lymphadenopathy:     Cervical: No cervical adenopathy.  Skin:    General: Skin is warm and dry.  Neurological:     Mental Status: She is alert.     Deep Tendon Reflexes: Reflexes are normal and symmetric.     Wt Readings from Last 3 Encounters:  02/24/22 164 lb (74.4 kg)  10/05/21 162 lb (73.5 kg)  04/07/21 162 lb (73.5 kg)    BP (!) 160/102 (BP Location: Right Arm, Cuff Size: Large)   Pulse 88   Ht '5\' 8"'$  (1.727 m)   Wt 164 lb (74.4 kg)   SpO2 96%   BMI 24.94 kg/m   Assessment and Plan:  1. Acute recurrent sinusitis, unspecified location New onset.  Persistent.  Residual from a respiratory illness since Christmas which is consistent with sinusitis.  Will treat with azithromycin to 50 mg 2 today followed by 1 a day for 4 days. - azithromycin (ZITHROMAX) 250 MG tablet; Take 2 tablets on day 1, then  1 tablet daily on days 2 through 5  Dispense: 6 tablet; Refill: 0  2. Essential hypertension Chronic.  Uncontrolled.  Stable.  Will increase carvedilol to 6.25 twice a day and will recheck in 6 weeks.  New onset.  Persistent cough from respiratory illness which may be consistent with a sinusitis - carvedilol (COREG) 6.25 MG tablet; Take 1 tablet (6.25 mg total) by mouth 2 (two) times daily with a meal.  Dispense: 60 tablet; Refill: 1  3. Subacute cough Given the persistent nature of the cough there may be some underlying reactive airway disease.  Chest x-ray will be done to see if there is any residual pneumonia COVID test was done and that was negative.  We will use Tessalon Perles. - benzonatate (TESSALON PERLES) 100 MG capsule; Take 1 capsule (100 mg total) by mouth 3 (three) times daily as needed for cough.  Dispense: 20 capsule; Refill: 0 - DG Chest 2 View - POC COVID-19  4. Screening-pulmonary TB Patient with need for screening for TB that we will obtain with QuantiFERON TB Gold plus. - QuantiFERON-TB Gold Plus  5. Physical exam, pre-employment Preemployment physical was done and all criteria has been met and we will proceed with screening for TB.   Otilio Miu, MD

## 2022-03-01 LAB — QUANTIFERON-TB GOLD PLUS
QuantiFERON Mitogen Value: >10 IU/mL
QuantiFERON Nil Value: 0.01 IU/mL
QuantiFERON TB1 Ag Value: 0.06 IU/mL
QuantiFERON TB2 Ag Value: 0.03 IU/mL
QuantiFERON-TB Gold Plus: NEGATIVE

## 2022-03-18 ENCOUNTER — Other Ambulatory Visit: Payer: Self-pay | Admitting: Family Medicine

## 2022-03-18 DIAGNOSIS — I10 Essential (primary) hypertension: Secondary | ICD-10-CM

## 2022-04-07 ENCOUNTER — Other Ambulatory Visit: Payer: Self-pay | Admitting: Family Medicine

## 2022-04-07 ENCOUNTER — Ambulatory Visit: Payer: Federal, State, Local not specified - PPO | Admitting: Family Medicine

## 2022-04-07 ENCOUNTER — Encounter: Payer: Self-pay | Admitting: Family Medicine

## 2022-04-07 VITALS — BP 134/88 | HR 78 | Ht 68.0 in | Wt 163.0 lb

## 2022-04-07 DIAGNOSIS — I5022 Chronic systolic (congestive) heart failure: Secondary | ICD-10-CM

## 2022-04-07 DIAGNOSIS — N179 Acute kidney failure, unspecified: Secondary | ICD-10-CM

## 2022-04-07 DIAGNOSIS — I10 Essential (primary) hypertension: Secondary | ICD-10-CM | POA: Diagnosis not present

## 2022-04-07 DIAGNOSIS — N1832 Chronic kidney disease, stage 3b: Secondary | ICD-10-CM | POA: Diagnosis not present

## 2022-04-07 MED ORDER — LOSARTAN POTASSIUM 100 MG PO TABS: ORAL_TABLET | ORAL | 1 refills | Status: DC

## 2022-04-07 MED ORDER — AMLODIPINE BESYLATE 10 MG PO TABS: 10.0000 mg | ORAL_TABLET | Freq: Every day | ORAL | 1 refills | Status: DC

## 2022-04-07 MED ORDER — HYDROCHLOROTHIAZIDE 25 MG PO TABS: ORAL_TABLET | ORAL | 1 refills | Status: DC

## 2022-04-07 MED ORDER — CARVEDILOL 6.25 MG PO TABS: 6.2500 mg | ORAL_TABLET | Freq: Two times a day (BID) | ORAL | 1 refills | Status: DC

## 2022-04-07 NOTE — Telephone Encounter (Signed)
Unable to refill per protocol, Rx request is too soon. Last refill 04/07/22 for 90 and 1 refill.  Requested Prescriptions  Pending Prescriptions Disp Refills   carvedilol (COREG) 6.25 MG tablet [Pharmacy Med Name: CARVEDILOL 6.25 MG TABLET] 180 tablet 1    Sig: TAKE 1 TABLET BY MOUTH TWICE A DAY WITH FOOD     Cardiovascular: Beta Blockers 3 Failed - 04/07/2022  8:23 AM      Failed - Cr in normal range and within 360 days    Creatinine, Ser  Date Value Ref Range Status  10/05/2021 1.65 (H) 0.44 - 1.00 mg/dL Final         Failed - AST in normal range and within 360 days    No results found for: "POCAST", "AST"       Failed - ALT in normal range and within 360 days    No results found for: "ALT", "LABALT", "POCALT"       Failed - Last BP in normal range    BP Readings from Last 1 Encounters:  04/07/22 134/88         Passed - Last Heart Rate in normal range    Pulse Readings from Last 1 Encounters:  04/07/22 78         Passed - Valid encounter within last 6 months    Recent Outpatient Visits           Today Essential hypertension   [facility] [place], Deanna C, MD   1 month ago Acute recurrent sinusitis, unspecified location   [facility] [place], Deanna C, MD   6 months ago Essential hypertension   [facility] [place], Deanna C, MD   1 year ago Essential hypertension   [facility] [place], Deanna C, MD   1 year ago Annual physical exam   [facility] [place], Craig, MD       Future Appointments             In 3 months Juline Patch, MD [place] [facility], [facility]

## 2022-04-07 NOTE — Progress Notes (Addendum)
Date:  04/07/2022   Name:  Alyssa Hunt   DOB:  25-Jul-1960   MRN:  [id number]   Chief Complaint: Hypertension  Hypertension This is a chronic problem. The current episode started more than 1 year ago. The problem has been gradually improving since onset. The problem is controlled. Pertinent negatives include no chest pain, headaches, orthopnea, palpitations, peripheral edema, PND or shortness of breath. There are no associated agents to hypertension. Risk factors for coronary artery disease include dyslipidemia. Past treatments include angiotensin blockers, calcium channel blockers, diuretics, beta blockers and alpha 1 blockers. The current treatment provides moderate improvement. There are no compliance problems.  There is no history of angina, kidney disease, CAD/MI, CVA, heart failure, left ventricular hypertrophy, PVD or retinopathy.    Lab Results  Component Value Date   NA 139 10/05/2021   K 3.3 (L) 10/05/2021   CO2 28 10/05/2021   GLUCOSE 111 (H) 10/05/2021   BUN 37 (H) 10/05/2021   CREATININE 1.65 (H) 10/05/2021   CALCIUM 9.1 10/05/2021   EGFR 37 (L) 04/12/2021   GFRNONAA 35 (L) 10/05/2021   Lab Results  Component Value Date   CHOL 198 04/12/2021   HDL 88 04/12/2021   LDLCALC 95 04/12/2021   TRIG 87 04/12/2021   CHOLHDL 2.3 08/03/2018   No results found for: "TSH" No results found for: "HGBA1C" No results found for: "WBC", "HGB", "HCT", "MCV", "PLT" No results found for: "ALT", "AST", "GGT", "ALKPHOS", "BILITOT" No results found for: "25OHVITD2", "25OHVITD3", "VD25OH"   Review of Systems  Constitutional:  Negative for diaphoresis, fatigue and unexpected weight change.  HENT:  Negative for trouble swallowing.   Eyes:  Negative for visual disturbance.  Respiratory:  Negative for chest tightness, shortness of breath and wheezing.   Cardiovascular:  Negative for chest pain, palpitations, orthopnea, leg swelling and PND.  Gastrointestinal:  Negative for  abdominal pain.  Endocrine: Negative for polydipsia and polyuria.  Genitourinary:  Negative for difficulty urinating and menstrual problem.  Neurological:  Negative for headaches.    Patient Active Problem List   Diagnosis Date Noted   Special screening for malignant neoplasms, colon    Benign neoplasm of cecum    Benign neoplasm of ascending colon    Benign neoplasm of transverse colon    Essential hypertension 03/10/2015    No Known Allergies  Past Surgical History:  Procedure Laterality Date   CESAREAN SECTION  1998   COLONOSCOPY WITH PROPOFOL N/A 03/16/2018   Procedure: COLONOSCOPY WITH BIOPSIES;  Surgeon: Lucilla Lame, MD;  Location: [facility];  Service: Endoscopy;  Laterality: N/A;   GASTRIC BYPASS     POLYPECTOMY N/A 03/16/2018   Procedure: POLYPECTOMY;  Surgeon: Lucilla Lame, MD;  Location: [facility];  Service: Endoscopy;  Laterality: N/A;    Social History   Tobacco Use   Smoking status: Former    Types: Cigarettes    Quit date: 1997    Years since quitting: 27.1   Smokeless tobacco: Never  Vaping Use   Vaping Use: Never used  Substance Use Topics   Alcohol use: Yes    Alcohol/week: 0.0 standard drinks of alcohol    Comment: may have 1-2 drinks/month   Drug use: No     Medication list has been reviewed and updated.  Current Meds  Medication Sig   amLODipine (NORVASC) 10 MG tablet Take 1 tablet (10 mg total) by mouth daily.   carvedilol (COREG) 6.25 MG tablet TAKE 1 TABLET BY MOUTH  2 TIMES DAILY WITH A MEAL.   hydrochlorothiazide (HYDRODIURIL) 25 MG tablet One a day   losartan (COZAAR) 100 MG tablet TAKE 1 TABLET BY MOUTH EVERY DAY ALONG WITH HYDROCHLOROTHIAZIDE DOSE       04/07/2022    9:06 AM 02/24/2022    9:11 AM 10/05/2021   10:18 AM 04/07/2021   10:25 AM  GAD 7 : Generalized Anxiety Score  Nervous, Anxious, on Edge 0 0 0 0  Control/stop worrying 0 0 0 0  Worry too much - different things 0 0 0 0  Trouble relaxing 0 0 0 0   Restless 0 0 0 0  Easily annoyed or irritable 0 0 0 0  Afraid - awful might happen 0 0 0 0  Total GAD 7 Score 0 0 0 0  Anxiety Difficulty Not difficult at all Not difficult at all Not difficult at all Not difficult at all       04/07/2022    9:06 AM 02/24/2022    9:10 AM 10/05/2021   10:18 AM  Depression screen PHQ 2/9  Decreased Interest 0 0 0  Down, Depressed, Hopeless 0 0 0  PHQ - 2 Score 0 0 0  Altered sleeping 0 0 0  Tired, decreased energy 0 0 0  Change in appetite 0 0 0  Feeling bad or failure about yourself  0 0 0  Trouble concentrating 0 0 0  Moving slowly or fidgety/restless 0 0 0  Suicidal thoughts 0 0 0  PHQ-9 Score 0 0 0  Difficult doing work/chores Not difficult at all Not difficult at all Not difficult at all    BP Readings from Last 3 Encounters:  04/07/22 (!) 140/90  02/24/22 (!) 160/102  10/05/21 (!) 142/94    Physical Exam Vitals and nursing note reviewed. Exam conducted with a chaperone present.  Constitutional:      General: She is not in acute distress.    Appearance: She is not diaphoretic.  HENT:     Head: Normocephalic and atraumatic.     Right Ear: External ear normal.     Left Ear: External ear normal.     Nose: Nose normal.  Eyes:     General:        Right eye: No discharge.        Left eye: No discharge.     Conjunctiva/sclera: Conjunctivae normal.     Pupils: Pupils are equal, round, and reactive to light.  Neck:     Thyroid: No thyromegaly.     Vascular: No JVD.  Cardiovascular:     Rate and Rhythm: Normal rate and regular rhythm.     Heart sounds: Normal heart sounds, S1 normal and S2 normal. No murmur heard.    No systolic murmur is present.     No diastolic murmur is present.     No friction rub. No gallop. No S3 or S4 sounds.  Pulmonary:     Effort: Pulmonary effort is normal.     Breath sounds: Normal breath sounds. No wheezing, rhonchi or rales.  Abdominal:     General: Bowel sounds are normal.     Palpations: Abdomen  is soft. There is no mass.     Tenderness: There is no abdominal tenderness. There is no guarding.  Musculoskeletal:        General: Normal range of motion.     Cervical back: Normal range of motion and neck supple.  Lymphadenopathy:     Cervical: No cervical adenopathy.  Skin:    General: Skin is warm and dry.  Neurological:     Mental Status: She is alert.     Deep Tendon Reflexes: Reflexes are normal and symmetric.     Wt Readings from Last 3 Encounters:  04/07/22 163 lb (73.9 kg)  02/24/22 164 lb (74.4 kg)  10/05/21 162 lb (73.5 kg)    BP (!) 140/90 (BP Location: Right Arm, Cuff Size: Large)   Pulse 78   Ht 5' 8"$  (1.727 m)   Wt 163 lb (73.9 kg)   SpO2 98%   BMI 24.78 kg/m   Assessment and Plan:  1. Essential hypertension Chronic.  Uncontrolled.  Stable.  Patient only took her medications about 30 minutes prior to coming to her appointment.  Blood pressure on the third reading was at 134/88 however this is with a larger cuff and patient fully exposed her arm for evaluation.  We have talked about the importance of taking her medication and we will recheck in 3 months.  In the meantime she will continue current medications of losartan 100 mg once a day.,  Hydrochlorothiazide 25 mg once a day, carvedilol 6.25 mg twice a day, and amlodipine 10 mg once a day,.  We will recheck renal function panel today to see if any intervention needs to be done prior to nephrology consult.  2. Acute renal failure superimposed on stage 3b chronic kidney disease, unspecified acute renal failure type (HCC) Chronic.  Uncontrolled.  Stable.  Fully evaluated by Dr. Latif/nephrology and was placed on Farxiga last visit for both her CKD and congestive heart failure but initially due to cost she was not able to afford but this was resubmitted by nephrology and apparently there is a breakdown of communication of receiving medication.  I have suggested that we recontact nephrology since patient was supposed  to see patient March 13, 2022 however she apparently did not go and we we will recontact Dr. Zollie Scale to see if this can  3. Chronic systolic CHF (congestive heart failure) ([place]) As noted above patient is supposed to be on Farxiga but she has not received medication and we are in the process of contacting nephrology for resubmitting of prescription.   Otilio Miu, MD

## 2022-04-07 NOTE — Addendum Note (Signed)
Addended by: Juline Patch on: 04/07/2022 09:41 AM   Modules accepted: Orders

## 2022-04-08 ENCOUNTER — Ambulatory Visit: Payer: Federal, State, Local not specified - PPO | Admitting: Family Medicine

## 2022-04-08 LAB — RENAL FUNCTION PANEL
Albumin: 3.7 g/dL — ABNORMAL LOW (ref 3.9–4.9)
BUN/Creatinine Ratio: 18 (ref 12–28)
BUN: 29 mg/dL — ABNORMAL HIGH (ref 8–27)
CO2: 24 mmol/L (ref 20–29)
Calcium: 8.9 mg/dL (ref 8.7–10.3)
Chloride: 106 mmol/L (ref 96–106)
Creatinine, Ser: 1.59 mg/dL — ABNORMAL HIGH (ref 0.57–1.00)
Glucose: 108 mg/dL — ABNORMAL HIGH (ref 70–99)
Phosphorus: 3.8 mg/dL (ref 3.0–4.3)
Potassium: 4.1 mmol/L (ref 3.5–5.2)
Sodium: 144 mmol/L (ref 134–144)
eGFR: 37 mL/min/{1.73_m2} — ABNORMAL LOW (ref >59)

## 2022-04-11 DIAGNOSIS — R809 Proteinuria, unspecified: Secondary | ICD-10-CM | POA: Diagnosis not present

## 2022-04-11 DIAGNOSIS — N1832 Chronic kidney disease, stage 3b: Secondary | ICD-10-CM | POA: Diagnosis not present

## 2022-04-11 DIAGNOSIS — I10 Essential (primary) hypertension: Secondary | ICD-10-CM | POA: Diagnosis not present

## 2022-04-11 DIAGNOSIS — I5022 Chronic systolic (congestive) heart failure: Secondary | ICD-10-CM | POA: Diagnosis not present

## 2022-07-26 ENCOUNTER — Encounter: Payer: Self-pay | Admitting: Family Medicine

## 2022-07-26 ENCOUNTER — Ambulatory Visit: Payer: Federal, State, Local not specified - PPO | Admitting: Family Medicine

## 2022-07-26 VITALS — BP 128/78 | HR 78 | Ht 68.0 in | Wt 161.0 lb

## 2022-07-26 DIAGNOSIS — I10 Essential (primary) hypertension: Secondary | ICD-10-CM | POA: Diagnosis not present

## 2022-07-26 DIAGNOSIS — J45909 Unspecified asthma, uncomplicated: Secondary | ICD-10-CM | POA: Diagnosis not present

## 2022-07-26 DIAGNOSIS — E785 Hyperlipidemia, unspecified: Secondary | ICD-10-CM

## 2022-07-26 DIAGNOSIS — R052 Subacute cough: Secondary | ICD-10-CM

## 2022-07-26 DIAGNOSIS — R739 Hyperglycemia, unspecified: Secondary | ICD-10-CM

## 2022-07-26 MED ORDER — HYDROCHLOROTHIAZIDE 25 MG PO TABS: ORAL_TABLET | ORAL | 1 refills | Status: DC

## 2022-07-26 MED ORDER — BENZONATATE 100 MG PO CAPS
100.0000 mg | ORAL_CAPSULE | Freq: Two times a day (BID) | ORAL | 0 refills | Status: DC | PRN
Start: 2022-07-26 — End: 2023-01-23

## 2022-07-26 MED ORDER — AMLODIPINE BESYLATE 10 MG PO TABS: 10.0000 mg | ORAL_TABLET | Freq: Every day | ORAL | 1 refills | Status: DC

## 2022-07-26 MED ORDER — LOSARTAN POTASSIUM 100 MG PO TABS: ORAL_TABLET | ORAL | 1 refills | Status: DC

## 2022-07-26 MED ORDER — CARVEDILOL 6.25 MG PO TABS: 6.2500 mg | ORAL_TABLET | Freq: Two times a day (BID) | ORAL | 1 refills | Status: DC

## 2022-07-26 NOTE — Progress Notes (Signed)
Date:  07/26/2022   Name:  Alyssa Hunt   DOB:  1960-07-03   MRN:  [id number]   Chief Complaint: Hypertension, Hyperglycemia, and Cough (Refill tessalon perles)  Hypertension This is a chronic problem. The current episode started more than 1 year ago. The problem has been waxing and waning since onset. The problem is controlled. Pertinent negatives include no anxiety, blurred vision, chest pain, headaches, neck pain, orthopnea, palpitations, peripheral edema, PND, shortness of breath or sweats. Past treatments include angiotensin blockers, diuretics, calcium channel blockers, beta blockers and alpha 1 blockers. The current treatment provides moderate improvement. There are no compliance problems.  There is no history of CAD/MI or CVA. There is no history of chronic renal disease, a hypertension causing med or renovascular disease.  Hyperglycemia This is a new problem. Associated symptoms include coughing. Pertinent negatives include no anorexia, arthralgias, change in bowel habit, chest pain, chills, diaphoresis, headaches, myalgias, nausea, neck pain, sore throat or urinary symptoms. Nothing aggravates the symptoms.  Cough This is a recurrent problem. The cough is Non-productive. Pertinent negatives include no chest pain, chills, headaches, myalgias, sore throat, shortness of breath, sweats or wheezing. The treatment provided moderate relief. There is no history of asthma. allergic bronchitis    Lab Results  Component Value Date   NA 144 04/07/2022   K 4.1 04/07/2022   CO2 24 04/07/2022   GLUCOSE 108 (H) 04/07/2022   BUN 29 (H) 04/07/2022   CREATININE 1.59 (H) 04/07/2022   CALCIUM 8.9 04/07/2022   EGFR 37 (L) 04/07/2022   GFRNONAA 35 (L) 10/05/2021   Lab Results  Component Value Date   CHOL 198 04/12/2021   HDL 88 04/12/2021   LDLCALC 95 04/12/2021   TRIG 87 04/12/2021   CHOLHDL 2.3 08/03/2018   No results found for: "TSH" No results found for: "HGBA1C" No results  found for: "WBC", "HGB", "HCT", "MCV", "PLT" No results found for: "ALT", "AST", "GGT", "ALKPHOS", "BILITOT" No results found for: "25OHVITD2", "25OHVITD3", "VD25OH"   Review of Systems  Constitutional:  Negative for chills and diaphoresis.  HENT:  Negative for sore throat.   Eyes:  Negative for blurred vision.  Respiratory:  Positive for cough. Negative for shortness of breath, wheezing and stridor.   Cardiovascular:  Negative for chest pain, palpitations, orthopnea, leg swelling and PND.  Gastrointestinal:  Negative for anorexia, change in bowel habit and nausea.  Musculoskeletal:  Negative for arthralgias, myalgias and neck pain.  Neurological:  Negative for headaches.    Patient Active Problem List   Diagnosis Date Noted   Special screening for malignant neoplasms, colon    Benign neoplasm of cecum    Benign neoplasm of ascending colon    Benign neoplasm of transverse colon    Essential hypertension 03/10/2015    No Known Allergies  Past Surgical History:  Procedure Laterality Date   CESAREAN SECTION  1998   COLONOSCOPY WITH PROPOFOL N/A 03/16/2018   Procedure: COLONOSCOPY WITH BIOPSIES;  Surgeon: Midge Minium, MD;  Location: [facility];  Service: Endoscopy;  Laterality: N/A;   GASTRIC BYPASS     POLYPECTOMY N/A 03/16/2018   Procedure: POLYPECTOMY;  Surgeon: Midge Minium, MD;  Location: [facility];  Service: Endoscopy;  Laterality: N/A;    Social History   Tobacco Use   Smoking status: Former    Types: Cigarettes    Quit date: 1997    Years since quitting: 27.4   Smokeless tobacco: Never  Vaping Use   Vaping  Use: Never used  Substance Use Topics   Alcohol use: Yes    Alcohol/week: 0.0 standard drinks of alcohol    Comment: may have 1-2 drinks/month   Drug use: No     Medication list has been reviewed and updated.  Current Meds  Medication Sig   amLODipine (NORVASC) 10 MG tablet Take 1 tablet (10 mg total) by mouth daily.   carvedilol  (COREG) 6.25 MG tablet Take 1 tablet (6.25 mg total) by mouth 2 (two) times daily with a meal.   FARXIGA 10 MG TABS tablet Take 10 mg by mouth every morning. lateef   hydrochlorothiazide (HYDRODIURIL) 25 MG tablet One a day   losartan (COZAAR) 100 MG tablet TAKE 1 TABLET BY MOUTH EVERY DAY ALONG WITH HYDROCHLOROTHIAZIDE DOSE       07/26/2022   11:02 AM 04/07/2022    9:06 AM 02/24/2022    9:11 AM 10/05/2021   10:18 AM  GAD 7 : Generalized Anxiety Score  Nervous, Anxious, on Edge 0 0 0 0  Control/stop worrying 0 0 0 0  Worry too much - different things 0 0 0 0  Trouble relaxing 0 0 0 0  Restless 0 0 0 0  Easily annoyed or irritable 0 0 0 0  Afraid - awful might happen 0 0 0 0  Total GAD 7 Score 0 0 0 0  Anxiety Difficulty Not difficult at all Not difficult at all Not difficult at all Not difficult at all       07/26/2022   11:02 AM 04/07/2022    9:06 AM 02/24/2022    9:10 AM  Depression screen PHQ 2/9  Decreased Interest 0 0 0  Down, Depressed, Hopeless 0 0 0  PHQ - 2 Score 0 0 0  Altered sleeping 0 0 0  Tired, decreased energy 0 0 0  Change in appetite 0 0 0  Feeling bad or failure about yourself  0 0 0  Trouble concentrating 0 0 0  Moving slowly or fidgety/restless 0 0 0  Suicidal thoughts 0 0 0  PHQ-9 Score 0 0 0  Difficult doing work/chores Not difficult at all Not difficult at all Not difficult at all    BP Readings from Last 3 Encounters:  07/26/22 128/78  04/07/22 134/88  02/24/22 (!) 160/102    Physical Exam Vitals and nursing note reviewed. Exam conducted with a chaperone present.  Constitutional:      General: She is not in acute distress.    Appearance: She is not diaphoretic.  HENT:     Head: Normocephalic and atraumatic.     Right Ear: Tympanic membrane and external ear normal.     Left Ear: Tympanic membrane and external ear normal.     Nose: Nose normal.     Mouth/Throat:     Mouth: Mucous membranes are moist.  Eyes:     General:        Right eye:  No discharge.        Left eye: No discharge.     Conjunctiva/sclera: Conjunctivae normal.     Pupils: Pupils are equal, round, and reactive to light.  Neck:     Thyroid: No thyromegaly.     Vascular: No JVD.  Cardiovascular:     Rate and Rhythm: Normal rate and regular rhythm.     Heart sounds: Normal heart sounds. No murmur heard.    No friction rub. No gallop.  Pulmonary:     Effort: Pulmonary effort is normal.  Breath sounds: Normal breath sounds.  Abdominal:     General: Bowel sounds are normal.     Palpations: Abdomen is soft. There is no mass.     Tenderness: There is no abdominal tenderness. There is no guarding.  Musculoskeletal:        General: Normal range of motion.     Cervical back: Normal range of motion and neck supple.  Lymphadenopathy:     Cervical: No cervical adenopathy.  Skin:    General: Skin is warm and dry.  Neurological:     Mental Status: She is alert.     Deep Tendon Reflexes: Reflexes are normal and symmetric.     Wt Readings from Last 3 Encounters:  07/26/22 161 lb (73 kg)  04/07/22 163 lb (73.9 kg)  02/24/22 164 lb (74.4 kg)    BP 128/78   Pulse 78   Ht 5\' 8"  (1.727 m)   Wt 161 lb (73 kg)   SpO2 98%   BMI 24.48 kg/m   Assessment and Plan:  1. Essential hypertension Chronic.  Controlled.  Stable.  Blood pressure 128/78.  Asymptomatic.  Tolerating all medications well.  Will continue amlodipine 10 mg once a day carvedilol 6.25 mg twice a day, hydrochlorothiazide 25 mg once a day, and losartan 100 mg once a day.  Will check CMP for electrolytes and GFR. - amLODipine (NORVASC) 10 MG tablet; Take 1 tablet (10 mg total) by mouth daily.  Dispense: 90 tablet; Refill: 1 - carvedilol (COREG) 6.25 MG tablet; Take 1 tablet (6.25 mg total) by mouth 2 (two) times daily with a meal.  Dispense: 180 tablet; Refill: 1 - hydrochlorothiazide (HYDRODIURIL) 25 MG tablet; One a day  Dispense: 90 tablet; Refill: 1 - losartan (COZAAR) 100 MG tablet; TAKE 1  TABLET BY MOUTH EVERY DAY ALONG WITH HYDROCHLOROTHIAZIDE DOSE  Dispense: 90 tablet; Refill: 1 - Comprehensive Metabolic Panel (CMET)  2. 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  This is controlled with Mucinex DM and Tessalon Perles.  Patient will obtain Mucinex DM over-the-counter and we will refill Tessalon Perles on as-needed basis. - benzonatate (TESSALON) 100 MG capsule; Take 1 capsule (100 mg total) by mouth 2 (two) times daily as needed for cough.  Dispense: 20 capsule; Refill: 0  3. Bronchitis, allergic, unspecified asthma severity, uncomplicated As seen above. - benzonatate (TESSALON) 100 MG capsule; Take 1 capsule (100 mg total) by mouth 2 (two) times daily as needed for cough.  Dispense: 20 capsule; Refill: 0  4. Hyperglycemia Patient with elevated glucose we will check A1c to see if we are approaching prediabetic range. - HgB A1c  5. Hyperlipidemia, unspecified hyperlipidemia type Minimal elevation a year ago normal reading of LDL last year we will repeat at 1 year basis for lipid panel LDL triglycerides. - Lipid Panel With LDL/HDL Ratio    Elizabeth Sauer, MD

## 2022-07-27 ENCOUNTER — Telehealth: Payer: Self-pay

## 2022-07-27 LAB — COMPREHENSIVE METABOLIC PANEL
ALT: 18 IU/L (ref 0–32)
AST: 20 IU/L (ref 0–40)
Albumin/Globulin Ratio: 1.1
Albumin: 3.7 g/dL — ABNORMAL LOW (ref 3.9–4.9)
Alkaline Phosphatase: 65 IU/L (ref 44–121)
BUN/Creatinine Ratio: 25 (ref 12–28)
BUN: 39 mg/dL — ABNORMAL HIGH (ref 8–27)
Bilirubin Total: 0.4 mg/dL (ref 0.0–1.2)
CO2: 25 mmol/L (ref 20–29)
Calcium: 9.3 mg/dL (ref 8.7–10.3)
Chloride: 105 mmol/L (ref 96–106)
Creatinine, Ser: 1.59 mg/dL — ABNORMAL HIGH (ref 0.57–1.00)
Globulin, Total: 3.3 g/dL (ref 1.5–4.5)
Glucose: 105 mg/dL — ABNORMAL HIGH (ref 70–99)
Potassium: 3.8 mmol/L (ref 3.5–5.2)
Sodium: 143 mmol/L (ref 134–144)
Total Protein: 7 g/dL (ref 6.0–8.5)
eGFR: 37 mL/min/{1.73_m2} — ABNORMAL LOW (ref >59)

## 2022-07-27 LAB — LIPID PANEL WITH LDL/HDL RATIO
Cholesterol, Total: 179 mg/dL (ref 100–199)
HDL: 65 mg/dL (ref >39)
LDL Chol Calc (NIH): 99 mg/dL (ref 0–99)
LDL/HDL Ratio: 1.5 ratio (ref 0.0–3.2)
Triglycerides: 80 mg/dL (ref 0–149)
VLDL Cholesterol Cal: 15 mg/dL (ref 5–40)

## 2022-07-27 LAB — HEMOGLOBIN A1C
Est. average glucose Bld gHb Est-mCnc: 123 mg/dL
Hgb A1c MFr Bld: 5.9 % — ABNORMAL HIGH (ref 4.8–5.6)

## 2022-07-27 NOTE — Progress Notes (Signed)
PC to pt, LVM to call back [facility] may give results, CRM created

## 2022-07-27 NOTE — Telephone Encounter (Signed)
Pt given lab results per notes of Dr. Yetta Barre on 07/27/22. Pt verbalized understanding.

## 2022-08-11 DIAGNOSIS — R809 Proteinuria, unspecified: Secondary | ICD-10-CM | POA: Diagnosis not present

## 2022-08-11 DIAGNOSIS — N1832 Chronic kidney disease, stage 3b: Secondary | ICD-10-CM | POA: Diagnosis not present

## 2022-08-11 DIAGNOSIS — I10 Essential (primary) hypertension: Secondary | ICD-10-CM | POA: Diagnosis not present

## 2022-08-11 DIAGNOSIS — I5022 Chronic systolic (congestive) heart failure: Secondary | ICD-10-CM | POA: Diagnosis not present

## 2022-08-19 ENCOUNTER — Other Ambulatory Visit: Payer: Self-pay | Admitting: Family Medicine

## 2022-08-19 DIAGNOSIS — I10 Essential (primary) hypertension: Secondary | ICD-10-CM

## 2022-08-19 MED ORDER — CARVEDILOL 6.25 MG PO TABS
6.2500 mg | ORAL_TABLET | Freq: Two times a day (BID) | ORAL | 1 refills | Status: DC
Start: 2022-08-19 — End: 2023-01-23

## 2022-08-19 NOTE — Telephone Encounter (Signed)
Medication Refill - Medication: carvedilol (COREG) 6.25 MG tablet [[id number]]   Patient reports that she took the last pill this morning.  Patient reports that the pharmacy did not receive June Script.  Has the patient contacted their pharmacy? Yes.   (Agent: If no, request that the patient contact the pharmacy for the refill. If patient does not wish to contact the pharmacy document the reason why and proceed with request.) (Agent: If yes, when and what did the pharmacy advise?)  Preferred Pharmacy (with phone number or street name):  [id number] [place], [place] - [address] Phone: [phone]  Fax: [phone]     Has the patient been seen for an appointment in the last year OR does the patient have an upcoming appointment? Yes.    Agent: Please be advised that RX refills may take up to 3 business days. We ask that you follow-up with your pharmacy.

## 2022-08-19 NOTE — Telephone Encounter (Signed)
Requested Prescriptions  Pending Prescriptions Disp Refills   carvedilol (COREG) 6.25 MG tablet 180 tablet 1    Sig: Take 1 tablet (6.25 mg total) by mouth 2 (two) times daily with a meal.     Cardiovascular: Beta Blockers 3 Failed - 08/19/2022  4:00 PM      Failed - Cr in normal range and within 360 days    Creatinine, Ser  Date Value Ref Range Status  07/26/2022 1.59 (H) 0.57 - 1.00 mg/dL Final         Passed - AST in normal range and within 360 days    AST  Date Value Ref Range Status  07/26/2022 20 0 - 40 IU/L Final         Passed - ALT in normal range and within 360 days    ALT  Date Value Ref Range Status  07/26/2022 18 0 - 32 IU/L Final         Passed - Last BP in normal range    BP Readings from Last 1 Encounters:  07/26/22 128/78         Passed - Last Heart Rate in normal range    Pulse Readings from Last 1 Encounters:  07/26/22 78         Passed - Valid encounter within last 6 months    Recent Outpatient Visits           3 weeks ago Essential hypertension   [place] [facility]   4 months ago Essential hypertension   [place] [facility]   5 months ago Acute recurrent sinusitis, unspecified location   [facility]   10 months ago Essential hypertension   [place] [facility]   1 year ago Essential hypertension   [place] [facility]       Future Appointments             In 4 months Duanne Limerick, MD [facility] at [facility]

## 2022-12-19 DIAGNOSIS — N184 Chronic kidney disease, stage 4 (severe): Secondary | ICD-10-CM | POA: Diagnosis not present

## 2022-12-19 DIAGNOSIS — I5022 Chronic systolic (congestive) heart failure: Secondary | ICD-10-CM | POA: Diagnosis not present

## 2022-12-19 DIAGNOSIS — I10 Essential (primary) hypertension: Secondary | ICD-10-CM | POA: Diagnosis not present

## 2022-12-19 DIAGNOSIS — N2581 Secondary hyperparathyroidism of renal origin: Secondary | ICD-10-CM | POA: Diagnosis not present

## 2023-01-10 ENCOUNTER — Ambulatory Visit: Payer: Federal, State, Local not specified - PPO | Admitting: Family Medicine

## 2023-01-23 ENCOUNTER — Ambulatory Visit: Payer: Federal, State, Local not specified - PPO | Admitting: Family Medicine

## 2023-01-23 ENCOUNTER — Encounter: Payer: Self-pay | Admitting: Family Medicine

## 2023-01-23 VITALS — BP 138/84 | HR 82 | Ht 68.0 in | Wt 175.0 lb

## 2023-01-23 DIAGNOSIS — I10 Essential (primary) hypertension: Secondary | ICD-10-CM

## 2023-01-23 DIAGNOSIS — Z23 Encounter for immunization: Secondary | ICD-10-CM | POA: Diagnosis not present

## 2023-01-23 DIAGNOSIS — F17200 Nicotine dependence, unspecified, uncomplicated: Secondary | ICD-10-CM | POA: Diagnosis not present

## 2023-01-23 MED ORDER — CARVEDILOL 6.25 MG PO TABS: 6.2500 mg | ORAL_TABLET | Freq: Two times a day (BID) | ORAL | 1 refills | Status: DC

## 2023-01-23 MED ORDER — AMLODIPINE BESYLATE 10 MG PO TABS: 10.0000 mg | ORAL_TABLET | Freq: Every day | ORAL | 1 refills | Status: DC

## 2023-01-23 MED ORDER — HYDROCHLOROTHIAZIDE 25 MG PO TABS: ORAL_TABLET | ORAL | 1 refills | Status: DC

## 2023-01-23 MED ORDER — LOSARTAN POTASSIUM 100 MG PO TABS: ORAL_TABLET | ORAL | 1 refills | Status: DC

## 2023-01-23 NOTE — Progress Notes (Signed)
Date:  01/23/2023   Name:  Alyssa Hunt   DOB:  13-Aug-1960   MRN:  [id number]   Chief Complaint: Hypertension  Hypertension This is a chronic problem. The current episode started more than 1 year ago. The problem has been gradually improving since onset. The problem is controlled. Pertinent negatives include no anxiety, blurred vision, chest pain, headaches, malaise/fatigue, neck pain, orthopnea, palpitations, peripheral edema, PND, shortness of breath or sweats. There are no associated agents to hypertension. Risk factors for coronary artery disease include smoking/tobacco exposure and dyslipidemia. Past treatments include angiotensin blockers, calcium channel blockers, diuretics, beta blockers and alpha 1 blockers. The current treatment provides moderate improvement. There are no compliance problems.  There is no history of angina or CVA. There is no history of chronic renal disease, a hypertension causing med or renovascular disease.    Lab Results  Component Value Date   NA 143 07/26/2022   K 3.8 07/26/2022   CO2 25 07/26/2022   GLUCOSE 105 (H) 07/26/2022   BUN 39 (H) 07/26/2022   CREATININE 1.59 (H) 07/26/2022   CALCIUM 9.3 07/26/2022   EGFR 37 (L) 07/26/2022   GFRNONAA 35 (L) 10/05/2021   Lab Results  Component Value Date   CHOL 179 07/26/2022   HDL 65 07/26/2022   LDLCALC 99 07/26/2022   TRIG 80 07/26/2022   CHOLHDL 2.3 08/03/2018   No results found for: "TSH" Lab Results  Component Value Date   HGBA1C 5.9 (H) 07/26/2022   No results found for: "WBC", "HGB", "HCT", "MCV", "PLT" Lab Results  Component Value Date   ALT 18 07/26/2022   AST 20 07/26/2022   ALKPHOS 65 07/26/2022   BILITOT 0.4 07/26/2022   No results found for: "25OHVITD2", "25OHVITD3", "VD25OH"   Review of Systems  Constitutional: Negative.  Negative for chills, fatigue, fever, malaise/fatigue and unexpected weight change.  HENT:  Negative for congestion, ear discharge, ear pain,  rhinorrhea, sinus pressure, sneezing and sore throat.   Eyes:  Negative for blurred vision.  Respiratory:  Negative for cough, chest tightness, shortness of breath, wheezing and stridor.   Cardiovascular:  Negative for chest pain, palpitations, orthopnea and PND.  Gastrointestinal:  Negative for abdominal pain, blood in stool, constipation, diarrhea and nausea.  Genitourinary:  Negative for dysuria, flank pain, frequency, hematuria, urgency and vaginal discharge.  Musculoskeletal:  Negative for arthralgias, back pain, myalgias and neck pain.  Skin:  Negative for rash.  Neurological:  Negative for dizziness, weakness and headaches.  Hematological:  Negative for adenopathy. Does not bruise/bleed easily.  Psychiatric/Behavioral:  Negative for dysphoric mood. The patient is not nervous/anxious.     Patient Active Problem List   Diagnosis Date Noted   Special screening for malignant neoplasms, colon    Benign neoplasm of cecum    Benign neoplasm of ascending colon    Benign neoplasm of transverse colon    Essential hypertension 03/10/2015    No Known Allergies  Past Surgical History:  Procedure Laterality Date   CESAREAN SECTION  1998   COLONOSCOPY WITH PROPOFOL N/A 03/16/2018   Procedure: COLONOSCOPY WITH BIOPSIES;  Surgeon: Midge Minium, MD;  Location: [facility];  Service: Endoscopy;  Laterality: N/A;   GASTRIC BYPASS     POLYPECTOMY N/A 03/16/2018   Procedure: POLYPECTOMY;  Surgeon: Midge Minium, MD;  Location: [facility];  Service: Endoscopy;  Laterality: N/A;    Social History   Tobacco Use   Smoking status: Former    Current packs/day:  0.00    Types: Cigarettes    Quit date: 1997    Years since quitting: 27.9   Smokeless tobacco: Never  Vaping Use   Vaping status: Never Used  Substance Use Topics   Alcohol use: Yes    Alcohol/week: 0.0 standard drinks of alcohol    Comment: may have 1-2 drinks/month   Drug use: No     Medication list has been  reviewed and updated.  Current Meds  Medication Sig   amLODipine (NORVASC) 10 MG tablet Take 1 tablet (10 mg total) by mouth daily.   calcitRIOL (ROCALTROL) 0.25 MCG capsule Take by mouth.   carvedilol (COREG) 6.25 MG tablet Take 1 tablet (6.25 mg total) by mouth 2 (two) times daily with a meal.   FARXIGA 10 MG TABS tablet Take 10 mg by mouth every morning. lateef   hydrochlorothiazide (HYDRODIURIL) 25 MG tablet One a day   losartan (COZAAR) 100 MG tablet TAKE 1 TABLET BY MOUTH EVERY DAY ALONG WITH HYDROCHLOROTHIAZIDE DOSE   [DISCONTINUED] benzonatate (TESSALON) 100 MG capsule Take 1 capsule (100 mg total) by mouth 2 (two) times daily as needed for cough.       01/23/2023    3:31 PM 07/26/2022   11:02 AM 04/07/2022    9:06 AM 02/24/2022    9:11 AM  GAD 7 : Generalized Anxiety Score  Nervous, Anxious, on Edge 0 0 0 0  Control/stop worrying 0 0 0 0  Worry too much - different things 0 0 0 0  Trouble relaxing 0 0 0 0  Restless 0 0 0 0  Easily annoyed or irritable 0 0 0 0  Afraid - awful might happen 0 0 0 0  Total GAD 7 Score 0 0 0 0  Anxiety Difficulty Not difficult at all Not difficult at all Not difficult at all Not difficult at all       01/23/2023    3:31 PM 07/26/2022   11:02 AM 04/07/2022    9:06 AM  Depression screen PHQ 2/9  Decreased Interest 0 0 0  Down, Depressed, Hopeless 0 0 0  PHQ - 2 Score 0 0 0  Altered sleeping 0 0 0  Tired, decreased energy 0 0 0  Change in appetite 0 0 0  Feeling bad or failure about yourself  0 0 0  Trouble concentrating 0 0 0  Moving slowly or fidgety/restless 0 0 0  Suicidal thoughts 0 0 0  PHQ-9 Score 0 0 0  Difficult doing work/chores Not difficult at all Not difficult at all Not difficult at all    BP Readings from Last 3 Encounters:  01/23/23 (!) 158/102  07/26/22 128/78  04/07/22 134/88    Physical Exam Vitals and nursing note reviewed. Exam conducted with a chaperone present.  Constitutional:      General: She is not in  acute distress.    Appearance: She is not diaphoretic.  HENT:     Head: Normocephalic and atraumatic.     Right Ear: External ear normal.     Left Ear: External ear normal.     Nose: Nose normal.  Eyes:     General:        Right eye: No discharge.        Left eye: No discharge.     Conjunctiva/sclera: Conjunctivae normal.     Pupils: Pupils are equal, round, and reactive to light.  Neck:     Thyroid: No thyromegaly.     Vascular: No JVD.  Cardiovascular:     Rate and Rhythm: Normal rate and regular rhythm.     Heart sounds: Normal heart sounds. No murmur heard.    No friction rub. No gallop.  Pulmonary:     Effort: Pulmonary effort is normal.     Breath sounds: Normal breath sounds.  Abdominal:     General: Bowel sounds are normal.     Palpations: Abdomen is soft. There is no mass.     Tenderness: There is no abdominal tenderness. There is no guarding.  Musculoskeletal:        General: Normal range of motion.     Cervical back: Normal range of motion and neck supple.  Lymphadenopathy:     Cervical: No cervical adenopathy.  Skin:    General: Skin is warm and dry.  Neurological:     Mental Status: She is alert.     Deep Tendon Reflexes: Reflexes are normal and symmetric.     Wt Readings from Last 3 Encounters:  01/23/23 175 lb (79.4 kg)  07/26/22 161 lb (73 kg)  04/07/22 163 lb (73.9 kg)    BP (!) 158/102   Pulse 82   Ht 5\' 8"  (1.727 m)   Wt 175 lb (79.4 kg)   SpO2 97%   BMI 26.61 kg/m   Assessment and Plan:  1. Essential hypertension Chronic.  Controlled.  Stable.  Blood pressure 138/84.  Asymptomatic.  Tolerating medications well.  Continue combination of amlodipine 10 mg once a day, carvedilol 6.25 mg twice a day, hydrochlorothiazide 25 mg once a day and losartan 100 mg once a day.  Patient had recent labs and is followed by nephrology for increased creatinine and decreased GFR.  It was thought with the slight worsening of the GFR at that this may have been  secondary to dehydration.  Blood pressure was at a reasonable range at his office at 146/80 and it is even better at I will evaluation today.  We will recheck patient in 6 months. - amLODipine (NORVASC) 10 MG tablet; Take 1 tablet (10 mg total) by mouth daily.  Dispense: 90 tablet; Refill: 1 - carvedilol (COREG) 6.25 MG tablet; Take 1 tablet (6.25 mg total) by mouth 2 (two) times daily with a meal.  Dispense: 180 tablet; Refill: 1 - hydrochlorothiazide (HYDRODIURIL) 25 MG tablet; One a day  Dispense: 90 tablet; Refill: 1 - losartan (COZAAR) 100 MG tablet; TAKE 1 TABLET BY MOUTH EVERY DAY ALONG WITH HYDROCHLOROTHIAZIDE DOSE  Dispense: 90 tablet; Refill: 1  2. Nicotine dependence with current use Patient has been advised of the health risks of smoking and counseled concerning cessation of tobacco products. I spent over 3 minutes for discussion and to answer questions.   3. Need for influenza vaccination Discussed and administered - Flu vaccine trivalent PF, 6mos and older(Flulaval,Afluria,Fluarix,Fluzone)    Elizabeth Sauer, MD

## 2023-04-19 DIAGNOSIS — R809 Proteinuria, unspecified: Secondary | ICD-10-CM | POA: Diagnosis not present

## 2023-04-19 DIAGNOSIS — I5022 Chronic systolic (congestive) heart failure: Secondary | ICD-10-CM | POA: Diagnosis not present

## 2023-04-19 DIAGNOSIS — I10 Essential (primary) hypertension: Secondary | ICD-10-CM | POA: Diagnosis not present

## 2023-04-19 DIAGNOSIS — N184 Chronic kidney disease, stage 4 (severe): Secondary | ICD-10-CM | POA: Diagnosis not present

## 2023-07-13 ENCOUNTER — Ambulatory Visit: Payer: Self-pay | Admitting: Family Medicine

## 2023-07-13 VITALS — BP 138/98 | HR 83 | Ht 68.0 in | Wt 187.0 lb

## 2023-07-13 DIAGNOSIS — I10 Essential (primary) hypertension: Secondary | ICD-10-CM | POA: Diagnosis not present

## 2023-07-13 MED ORDER — AMLODIPINE BESYLATE 10 MG PO TABS: 10.0000 mg | ORAL_TABLET | Freq: Every day | ORAL | 1 refills | Status: DC

## 2023-07-13 MED ORDER — HYDROCHLOROTHIAZIDE 25 MG PO TABS: ORAL_TABLET | ORAL | 1 refills | Status: DC

## 2023-07-13 MED ORDER — LOSARTAN POTASSIUM 100 MG PO TABS: ORAL_TABLET | ORAL | 1 refills | Status: DC

## 2023-07-13 MED ORDER — CARVEDILOL 6.25 MG PO TABS: 6.2500 mg | ORAL_TABLET | Freq: Two times a day (BID) | ORAL | 1 refills | Status: DC

## 2023-07-13 NOTE — Progress Notes (Signed)
 Date:  07/13/2023   Name:  Alyssa Hunt   DOB:  05-26-1960   MRN:  [id number]   Chief Complaint: Hypertension (Patient presents today for a follow up on her HTN. She hs been taking her medications as directed. She does not have any concerns for today's visit.)  Hypertension This is a chronic problem. The current episode started more than 1 year ago. The problem has been gradually improving since onset. The problem is controlled. Pertinent negatives include no anxiety, blurred vision, chest pain, headaches, malaise/fatigue, neck pain, orthopnea, palpitations, peripheral edema, PND, shortness of breath or sweats. There are no associated agents to hypertension. Risk factors for coronary artery disease include diabetes mellitus. The current treatment provides mild improvement. There are no compliance problems.  There is no history of angina, CAD/MI or CVA. There is no history of chronic renal disease, a hypertension causing med or renovascular disease.    Lab Results  Component Value Date   NA 143 07/26/2022   K 3.8 07/26/2022   CO2 25 07/26/2022   GLUCOSE 105 (H) 07/26/2022   BUN 39 (H) 07/26/2022   CREATININE 1.59 (H) 07/26/2022   CALCIUM 9.3 07/26/2022   EGFR 37 (L) 07/26/2022   GFRNONAA 35 (L) 10/05/2021   Lab Results  Component Value Date   CHOL 179 07/26/2022   HDL 65 07/26/2022   LDLCALC 99 07/26/2022   TRIG 80 07/26/2022   CHOLHDL 2.3 08/03/2018   No results found for: "TSH" Lab Results  Component Value Date   HGBA1C 5.9 (H) 07/26/2022   No results found for: "WBC", "HGB", "HCT", "MCV", "PLT" Lab Results  Component Value Date   ALT 18 07/26/2022   AST 20 07/26/2022   ALKPHOS 65 07/26/2022   BILITOT 0.4 07/26/2022   No results found for: "25OHVITD2", "25OHVITD3", "VD25OH"   Review of Systems  Constitutional:  Negative for malaise/fatigue and unexpected weight change.  Eyes:  Negative for blurred vision and visual disturbance.  Respiratory:  Negative  for cough, chest tightness, shortness of breath and wheezing.   Cardiovascular:  Negative for chest pain, palpitations, orthopnea and PND.  Gastrointestinal:  Negative for blood in stool.  Endocrine: Negative for polydipsia and polyuria.  Genitourinary:  Negative for hematuria.  Musculoskeletal:  Negative for neck pain.  Neurological:  Negative for headaches.    Patient Active Problem List   Diagnosis Date Noted   Special screening for malignant neoplasms, colon    Benign neoplasm of cecum    Benign neoplasm of ascending colon    Benign neoplasm of transverse colon    Essential hypertension 03/10/2015    No Known Allergies  Past Surgical History:  Procedure Laterality Date   CESAREAN SECTION  1998   COLONOSCOPY WITH PROPOFOL  N/A 03/16/2018   Procedure: COLONOSCOPY WITH BIOPSIES;  Surgeon: Marnee Sink, MD;  Location: [facility];  Service: Endoscopy;  Laterality: N/A;   GASTRIC BYPASS     POLYPECTOMY N/A 03/16/2018   Procedure: POLYPECTOMY;  Surgeon: Marnee Sink, MD;  Location: [facility];  Service: Endoscopy;  Laterality: N/A;    Social History   Tobacco Use   Smoking status: Former    Current packs/day: 0.00    Types: Cigarettes    Quit date: 1997    Years since quitting: 28.4   Smokeless tobacco: Never  Vaping Use   Vaping status: Never Used  Substance Use Topics   Alcohol use: Yes    Alcohol/week: 0.0 standard drinks of alcohol  Comment: may have 1-2 drinks/month   Drug use: No     Medication list has been reviewed and updated.  Current Meds  Medication Sig   amLODipine  (NORVASC ) 10 MG tablet Take 1 tablet (10 mg total) by mouth daily.   calcitRIOL (ROCALTROL) 0.25 MCG capsule Take by mouth.   carvedilol  (COREG ) 6.25 MG tablet Take 1 tablet (6.25 mg total) by mouth 2 (two) times daily with a meal.   hydrochlorothiazide  (HYDRODIURIL ) 25 MG tablet One a day   losartan  (COZAAR ) 100 MG tablet TAKE 1 TABLET BY MOUTH EVERY DAY ALONG WITH  HYDROCHLOROTHIAZIDE  DOSE       01/23/2023    3:31 PM 07/26/2022   11:02 AM 04/07/2022    9:06 AM 02/24/2022    9:11 AM  GAD 7 : Generalized Anxiety Score  Nervous, Anxious, on Edge 0 0 0 0  Control/stop worrying 0 0 0 0  Worry too much - different things 0 0 0 0  Trouble relaxing 0 0 0 0  Restless 0 0 0 0  Easily annoyed or irritable 0 0 0 0  Afraid - awful might happen 0 0 0 0  Total GAD 7 Score 0 0 0 0  Anxiety Difficulty Not difficult at all Not difficult at all Not difficult at all Not difficult at all       01/23/2023    3:31 PM 07/26/2022   11:02 AM 04/07/2022    9:06 AM  Depression screen PHQ 2/9  Decreased Interest 0 0 0  Down, Depressed, Hopeless 0 0 0  PHQ - 2 Score 0 0 0  Altered sleeping 0 0 0  Tired, decreased energy 0 0 0  Change in appetite 0 0 0  Feeling bad or failure about yourself  0 0 0  Trouble concentrating 0 0 0  Moving slowly or fidgety/restless 0 0 0  Suicidal thoughts 0 0 0  PHQ-9 Score 0 0 0  Difficult doing work/chores Not difficult at all Not difficult at all Not difficult at all    BP Readings from Last 3 Encounters:  07/13/23 (!) 138/98  01/23/23 138/84  07/26/22 128/78    Physical Exam Vitals and nursing note reviewed.  Constitutional:      General: She is not in acute distress.    Appearance: She is not diaphoretic.  HENT:     Head: Normocephalic and atraumatic.     Right Ear: Tympanic membrane and external ear normal.     Left Ear: Tympanic membrane and external ear normal.     Nose: Nose normal.     Mouth/Throat:     Mouth: Mucous membranes are moist.  Eyes:     General:        Right eye: No discharge.        Left eye: No discharge.     Conjunctiva/sclera: Conjunctivae normal.     Pupils: Pupils are equal, round, and reactive to light.  Neck:     Thyroid: No thyromegaly.     Vascular: No JVD.  Cardiovascular:     Rate and Rhythm: Normal rate and regular rhythm.     Heart sounds: Normal heart sounds, S1 normal and S2  normal. No murmur heard.    No systolic murmur is present.     No diastolic murmur is present.     No friction rub. No gallop.  Pulmonary:     Effort: Pulmonary effort is normal.     Breath sounds: Normal breath sounds. No wheezing,  rhonchi or rales.  Abdominal:     General: Bowel sounds are normal.     Palpations: Abdomen is soft. There is no mass.     Tenderness: There is no abdominal tenderness. There is no guarding.  Musculoskeletal:        General: Normal range of motion.     Cervical back: Normal range of motion and neck supple.     Right lower leg: No edema.     Left lower leg: No edema.  Lymphadenopathy:     Cervical: No cervical adenopathy.  Skin:    General: Skin is warm and dry.  Neurological:     Mental Status: She is alert.     Deep Tendon Reflexes: Reflexes are normal and symmetric.     Wt Readings from Last 3 Encounters:  07/13/23 187 lb (84.8 kg)  01/23/23 175 lb (79.4 kg)  07/26/22 161 lb (73 kg)    BP (!) 138/98   Pulse 83   Ht 5\' 8"  (1.727 m)   Wt 187 lb (84.8 kg)   SpO2 98%   BMI 28.43 kg/m   Assessment and Plan: 1. Essential hypertension (Primary) Chronic.  Controlled.  Stable.  Asymptomatic.  Patient has not been taking her Farxiga because the sample she was given were out of date however I am not certain that that was the better decision and should have probably been run by her nephrologist before in the meantime she will continue medications from myself including amlodipine  10 mg carvedilol  6.25 mg twice a day hydrochlorothiazide  25 mg and losartan  100 mg daily.  Will check renal function panel for evaluation electrolytes and GFR.  Patient is to return in July to see Dr. Erminio Hazy but I have suggested that she call sooner and may need to see him earlier for her adjustment of the Farxiga - amLODipine  (NORVASC ) 10 MG tablet; Take 1 tablet (10 mg total) by mouth daily.  Dispense: 90 tablet; Refill: 1 - carvedilol  (COREG ) 6.25 MG tablet; Take 1 tablet (6.25  mg total) by mouth 2 (two) times daily with a meal.  Dispense: 180 tablet; Refill: 1 - hydrochlorothiazide  (HYDRODIURIL ) 25 MG tablet; One a day  Dispense: 90 tablet; Refill: 1 - losartan  (COZAAR ) 100 MG tablet; TAKE 1 TABLET BY MOUTH EVERY DAY ALONG WITH HYDROCHLOROTHIAZIDE  DOSE  Dispense: 90 tablet; Refill: 1 - Renal Function Panel     Alayne Allis, MD

## 2023-07-14 ENCOUNTER — Ambulatory Visit: Payer: Self-pay | Admitting: Family Medicine

## 2023-07-14 LAB — RENAL FUNCTION PANEL
Albumin: 4 g/dL (ref 3.9–4.9)
BUN/Creatinine Ratio: 25 (ref 12–28)
BUN: 43 mg/dL — ABNORMAL HIGH (ref 8–27)
CO2: 19 mmol/L — ABNORMAL LOW (ref 20–29)
Calcium: 9.3 mg/dL (ref 8.7–10.3)
Chloride: 104 mmol/L (ref 96–106)
Creatinine, Ser: 1.74 mg/dL — ABNORMAL HIGH (ref 0.57–1.00)
Glucose: 81 mg/dL (ref 70–99)
Phosphorus: 4.4 mg/dL — ABNORMAL HIGH (ref 3.0–4.3)
Potassium: 4.6 mmol/L (ref 3.5–5.2)
Sodium: 142 mmol/L (ref 134–144)
eGFR: 33 mL/min/{1.73_m2} — ABNORMAL LOW (ref >59)

## 2023-08-24 DIAGNOSIS — N184 Chronic kidney disease, stage 4 (severe): Secondary | ICD-10-CM | POA: Diagnosis not present

## 2023-08-24 DIAGNOSIS — I5022 Chronic systolic (congestive) heart failure: Secondary | ICD-10-CM | POA: Diagnosis not present

## 2023-08-24 DIAGNOSIS — I10 Essential (primary) hypertension: Secondary | ICD-10-CM | POA: Diagnosis not present

## 2023-08-24 DIAGNOSIS — R809 Proteinuria, unspecified: Secondary | ICD-10-CM | POA: Diagnosis not present

## 2023-08-24 NOTE — Progress Notes (Signed)
 Follow Up Visit   Patient Name: Alyssa Hunt, female   Patient DOB: 10-29-1960 Date of Service: 08/24/2023  Patient MRN: [id number] Provider Creating Note: Bonnell Sherry, MD  [phone] Primary Care Physician:   [address] [place] [address] Additional Physicians/ Providers:    History of Present Illness Alyssa Hunt is a 63 y.o. female who is following up today for chronic kidney disease stage IV, proteinuria, hypertension, and chronic systolic heart failure.  The patient's chronic kidney disease appears to be slightly improved with most recent EGFR of 28 with a urine protein creatinine ratio 0.5.  In regards hypertension pressure currently 140/86.  Patient also has chronic systolic heart failure and the patient's weight is up to 189 pounds however this appears to be in the form of body weight gain as she does not appear to have any lower extremity edema and did not have any rales on exam.  Finally patient also has secondary hyperparathyroidism with most recent PTH of 160, phosphorus 3.1, calcium 9.3.  She denies nausea, vomiting, or dysphagia.  Medications   Current Outpatient Medications:  .  Dapagliflozin Propanediol 10 MG tablet, Take 10 mg by mouth 1 (one) time each day in the morning, Disp: 270 tablet, Rfl: 3 .  acetaminophen  (TYLENOL ) 500 MG tablet, Take by mouth every 6 (six) hours if needed for mild pain, Disp: , Rfl:  .  amLODIPine  (NORVASC ) 10 MG tablet, Take 1 tablet (10 mg total) by mouth 1 (one) time each day, Disp: 90 tablet, Rfl: 3 .  calcitriol (Rocaltrol) 0.25 MCG capsule, Take 1 capsule (0.25 mcg total) by mouth 1 (one) time each day, Disp: 90 capsule, Rfl: 3 .  carvedilol  (COREG ) 3.125 MG tablet, Take 3.125 mg by mouth in the morning and 3.125 mg in the evening. Take with meals., Disp: , Rfl:  .  losartan -hydroCHLOROthiazide  (HYZAAR) 100-25 MG per tablet, Take 1 tablet by mouth in the morning., Disp: , Rfl:    Allergies Patient has no known  allergies.  Problem List There is no problem list on file for this patient.    Review of Systems  Constitutional:  Negative for chills and fever.  Respiratory:  Negative for cough and shortness of breath.   Cardiovascular:  Negative for chest pain and palpitations.  Gastrointestinal:  Negative for nausea and vomiting.  Genitourinary:  Negative for dysuria, hematuria and urgency.     History Past Medical History:  Diagnosis Date  . Cardiomyopathy (HCC)   . Congestive heart failure (HCC)   . Essential hypertension   . Left ventricular hypertrophy     Past Surgical History:  Procedure Laterality Date  . GASTRIC BYPASS     Family History  Problem Relation Age of Onset  . Hypertension Mother   . Diabetes Mother   . Kidney disease Father   . Hypertension Father   . Diabetes Father   . Cancer Father    Social History   Tobacco Use  . Smoking status: Former    Types: Cigarettes  . Smokeless tobacco: Never  Substance Use Topics  . Alcohol use: Yes    Comment: social        Physical Exam  Vitals BP 140/86 (BP Location: Right upper arm, Patient Position: Sitting)   Pulse 71   Temp 97.5 F   Wt 189 lb (85.7 kg)   SpO2 99%   BMI 33.48 kg/m   PHYSICAL EXAM: General appearance: well developed, well nourished, NAD Eyes: anicteric sclerae, moist conjunctivae; no  lid-lag  HENT: Atraumatic; hearing intact Neck: Trachea midline; supple Lungs: CTAB, with normal respiratory effort  CV: S1S2, no murmurs or rubs. Abdomen: Soft, non-tender; bowel sounds present Extremities: No peripheral edema Skin: Warm and dry, normal skin turgor, no rashes noted. Psych: Appropriate affect, alert and oriented to person, place and time    Laboratory Studies  Chemistry  Lab Units 04/19/23 1413 12/19/22 1515 08/11/22 1207 04/11/22 1129 11/11/21 1432  SODIUM mmol/L 141 142 142 141 141  POTASSIUM mmol/L 3.9 3.9 4.1 3.9 3.6  CHLORIDE mmol/L 106 106 104 107 106  CO2 mmol/L 27  26 28 24 29   CALCIUM mg/dL 9.3 9.1 9.1 9.1 9.4  PHOSPHORUS mg/dL 3.1 3.7 4.5 3.7 3.4  PTH pg/mL 160* 273* 117* 201* 133*  GLUCOSE mg/dL 75 80 95 831* 893  ALBUMIN g/dL 3.8 3.9 3.8 3.8 4.0  BUN mg/dL 45* 42* 39* 37* 45*  CREATININE mg/dL 8.03* 7.83* 7.91* 8.45* 1.81*        No lab exists for component: IRON SATURATION, TRANSSATPER  CBC  Lab Units 04/19/23 1413 12/19/22 1515 08/11/22 1207 04/11/22 1129 11/11/21 1432  WBC AUTO Thousand/uL 5.5 6.2 4.5 5.2 5.9  HEMOGLOBIN g/dL 86.6 85.8 85.8 85.9 84.9  HEMATOCRIT % 40.7 42.5 42.4 41.6 45.3*  MCV fL 96.9 97.3 95.3 94.1 98.3  PLATELETS AUTO Thousand/uL 327 224 186 218 221    Urine  Lab Units 04/19/23 1413 12/19/22 1515 08/11/22 1207 04/11/22 1129 11/11/21 1432  PROT/CREAT RATIO UR mg/g creat 0.571*  571* 0.673*  673* 0.731*  731*   < >  --   ALB MG/G CREAT UR mcg/mg creat  --   --   --   --  819*   < > = values in this interval not displayed.    Lab Results  Component Value Date   PTH 160 (H) 04/19/2023   CALCIUM 9.3 04/19/2023   PHOS 3.1 04/19/2023     Imaging and Other Studies  05/27/2021: Negative SPEP/UPEP.  Positive ANA with negative secondary panel. 05/31/2021: Right kidney 13 cm, left kidney 10.2 cm, multiple simple cyst in the right kidney not requiring any further follow-up.   Orders Placed This Encounter  . Renal Function Panel  . CBC and Differential  . PTH, Intact  . Protein, Total, Random Urine w/Creatinine (Protein/Creat Ratio)  . Dapagliflozin Propanediol 10 MG tablet       Impression/Recommendations  Alyssa Hunt is a 63 y.o. female with  past medical history of chronic systolic heart failure ejection fraction 45%, hypertension, LVH, gastric bypass who was referred the evaluation of chronic kidney disease stage IIIb.   1.  Chronic kidney disease stage 4/proteinuria.  The patient's chronic kidney disease appears to be stable most recent EGFR 28 with a urine protein creatinine ratio 0.5.   We plan to maintain the patient on both losartan  and Farxiga.  We did provide the patient with Farxiga samples today.  Follow-up renal parameters today.  2.  Hypertension.  Blood pressure under reasonable control at 140/86.  Maintain the patient on current doses of amlodipine , carvedilol , and losartan /HCTZ.  3.  Chronic systolic heart failure.  Interval weight gain noted to 189 pounds.  However this appears to be in the form of body weight gain and not fluid weight gain as the patient has no rales on exam and no edema on exam.  Patient reports that she has had some dietary indiscretion.  She has reduced her caloric intake and is planning to exercise more.  4.  Secondary hyperparathyroidism.  Most recent PTH was 160, phosphorus 3.1, calcium 9.3.  No immediate need for phosphorus binders however patient will be maintained on Calcitrol 0.25 mcg daily.  Follow-up bone metabolism parameters today.  Return in about 4 months (around 12/25/2023).   Munsoor Lateef, MD

## 2023-09-12 NOTE — Telephone Encounter (Signed)
Pt notified about lab results  

## 2023-11-05 IMAGING — US US RENAL
1 series · 14 of 25 positions shown · non-contrast
Comparison: None.

CLINICAL DATA: Stage I chronic renal.

EXAM:
RENAL / URINARY TRACT ULTRASOUND COMPLETE

[Series 1: us renal · 0.23mm/px · 14 of 40 slices shown]
[im 1/40]
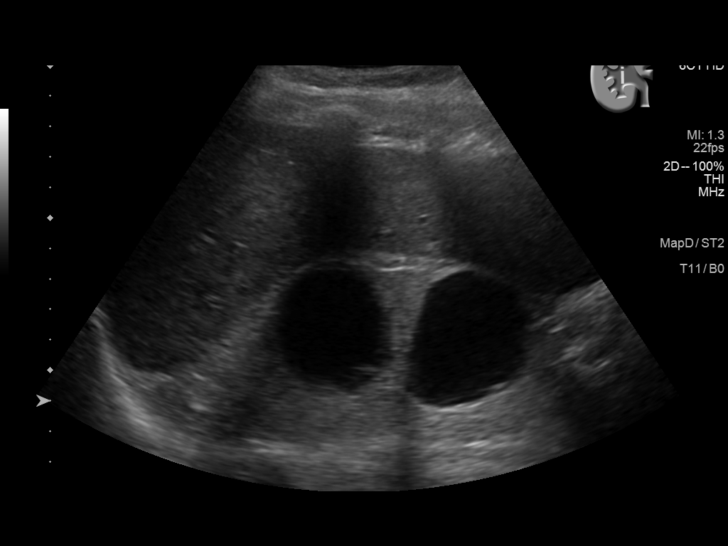
[im 4/40]
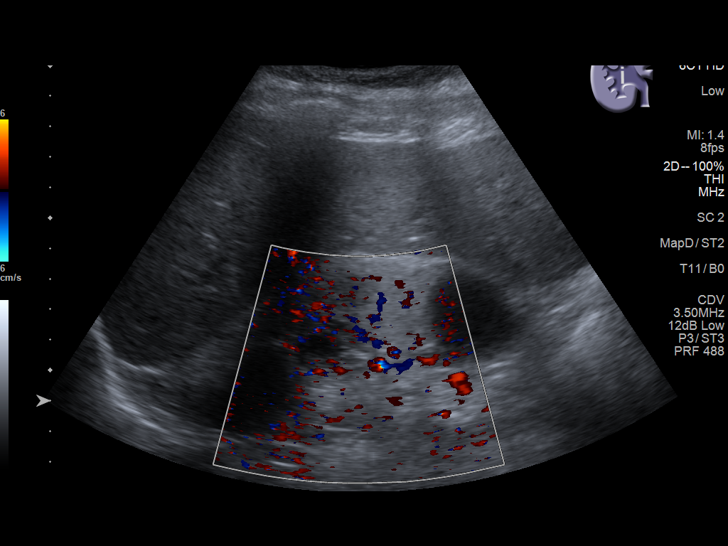
[im 7/40]
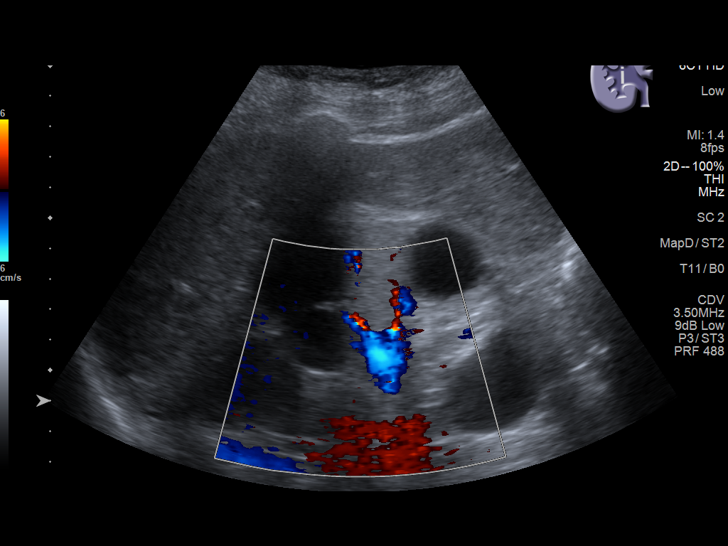
[im 10/40]
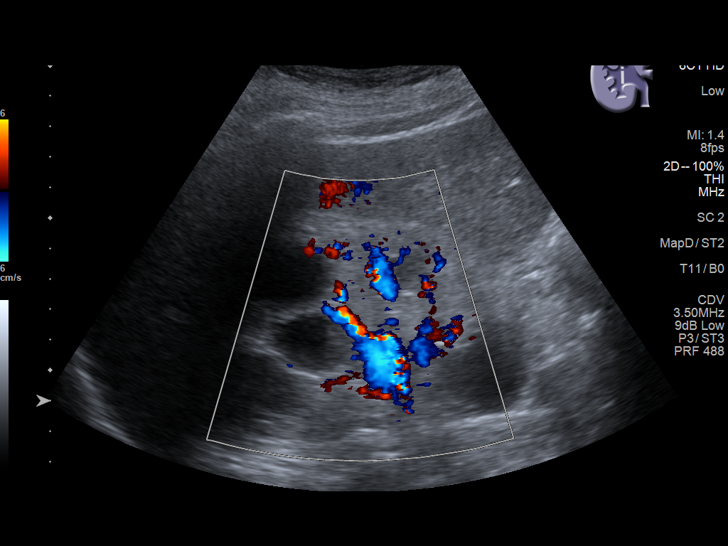
[im 14/40]
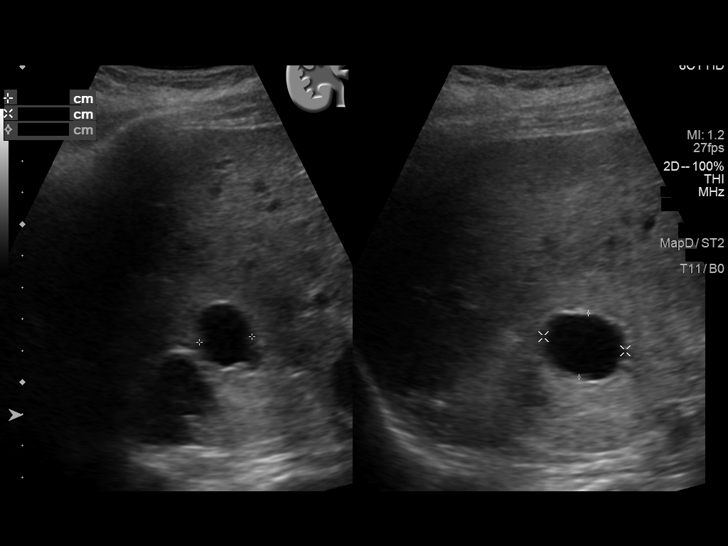
[im 15/40]
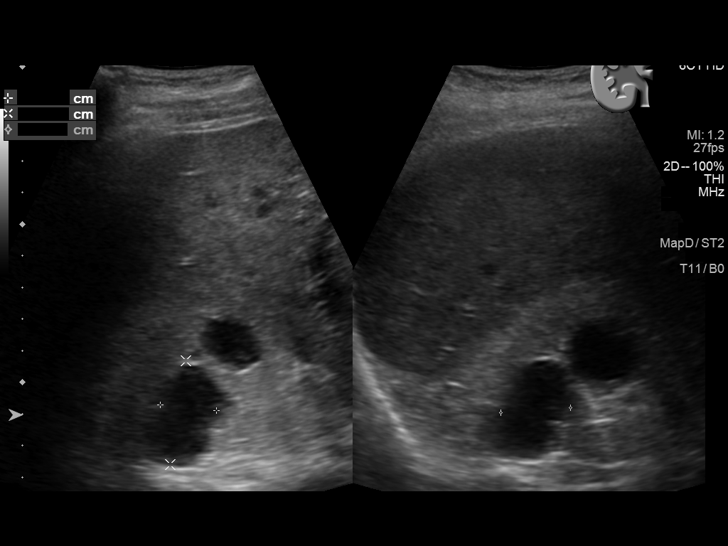
[im 18/40]
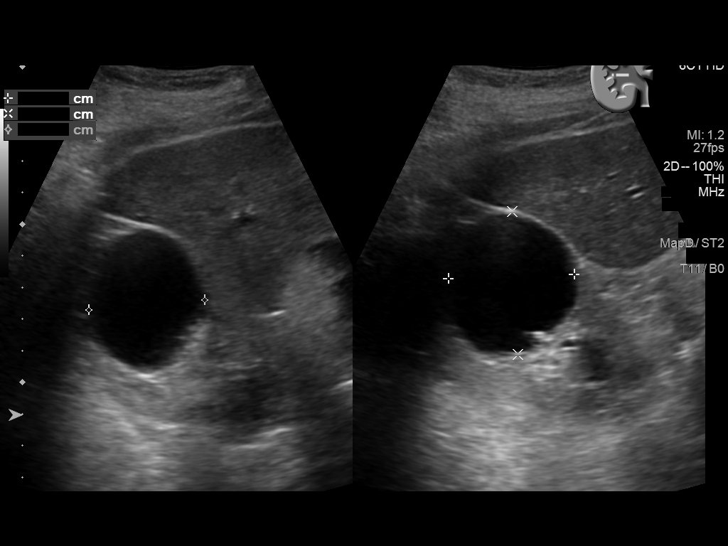
[im 22/40]
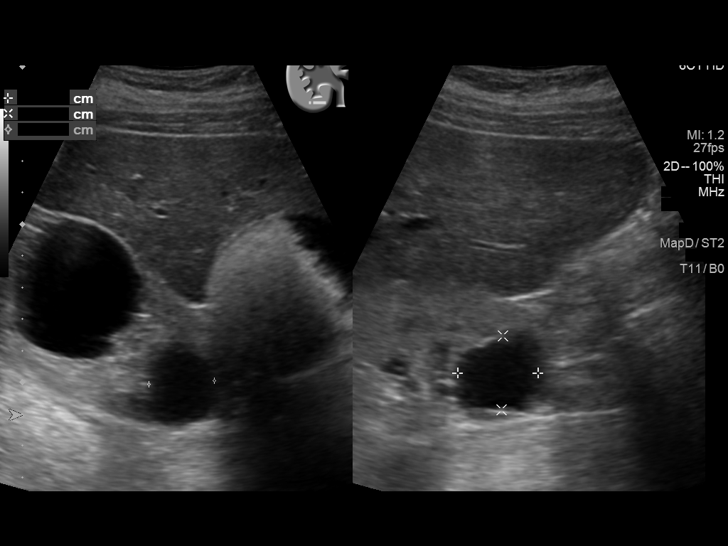
[im 25/40]
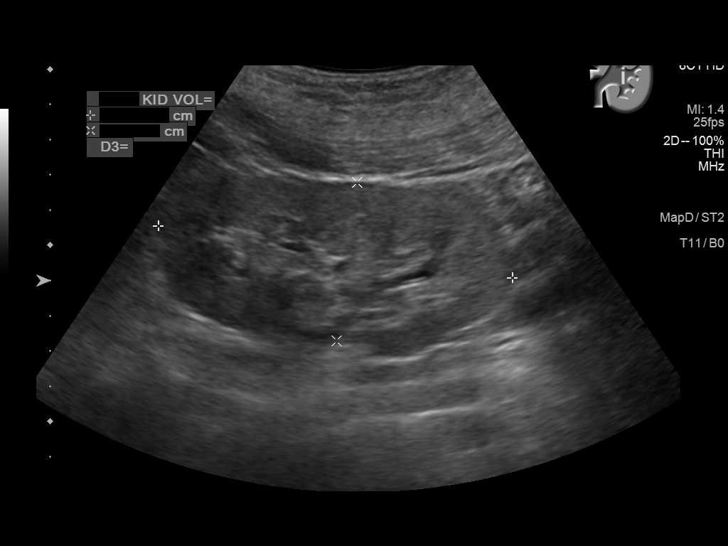
[im 27/40]
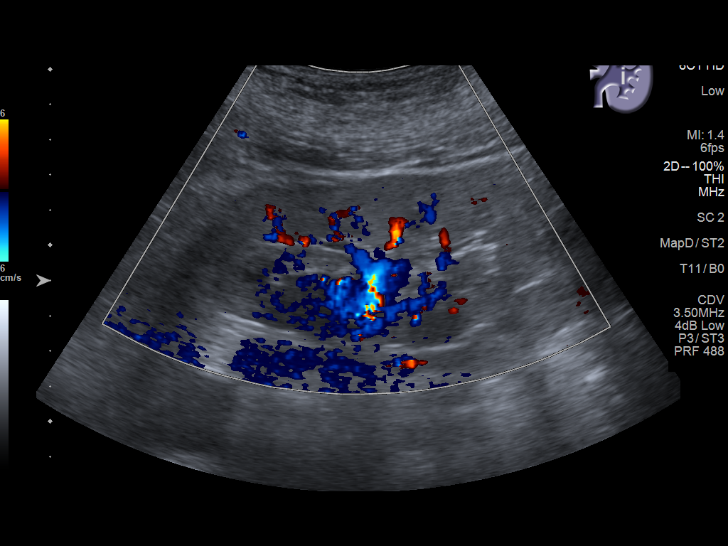
[im 30/40]
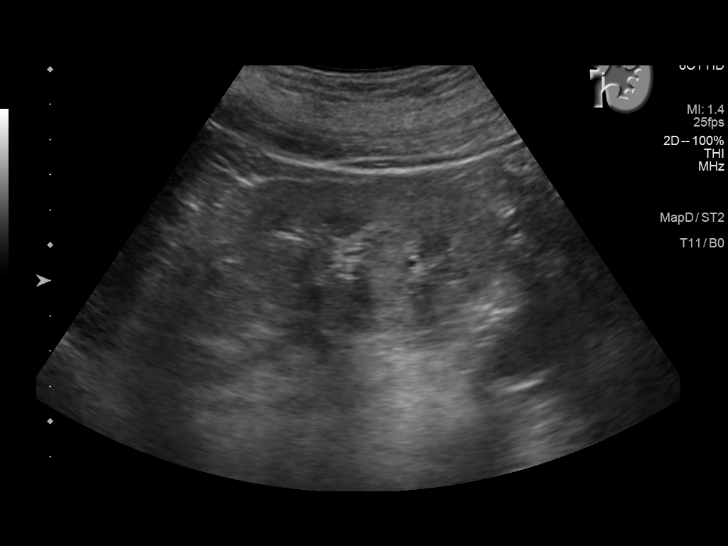
[im 33/40]
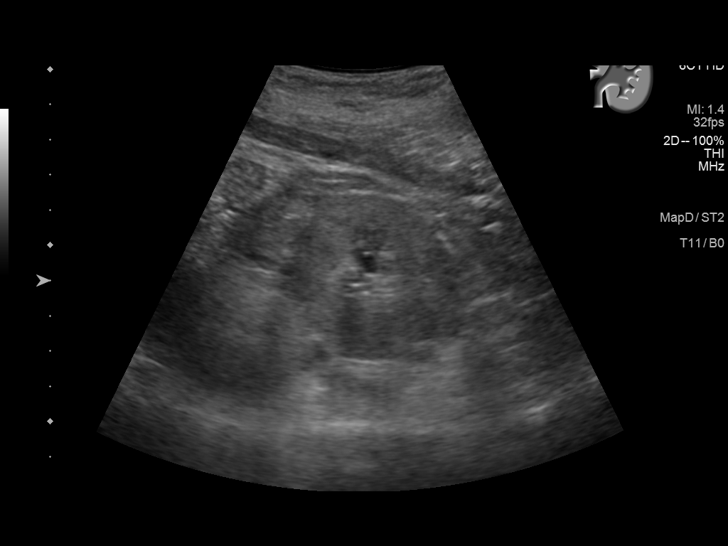
[im 36/40]
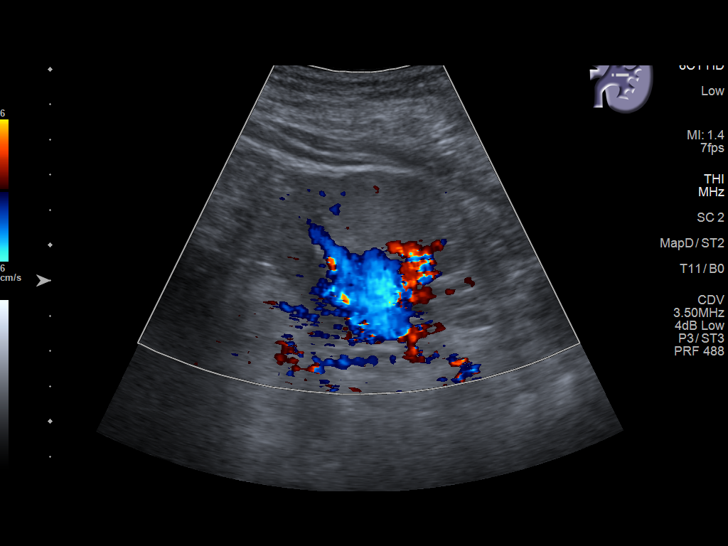
[im 40/40]
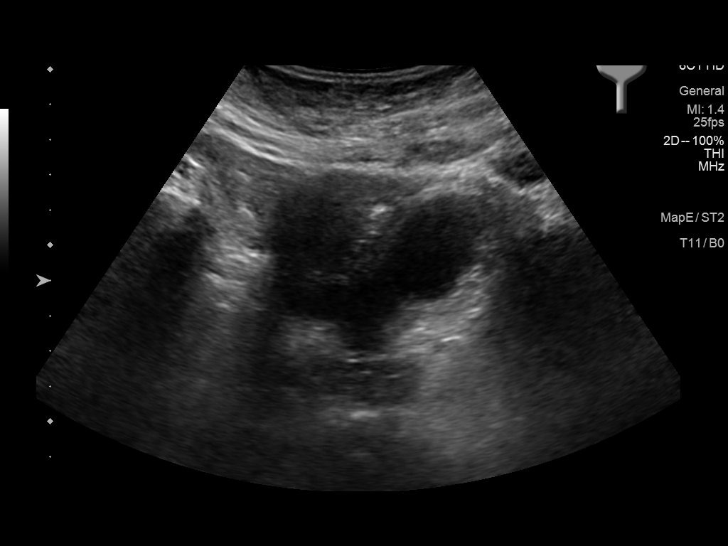

[14 of 25 positions shown; findings below may reference images not displayed]

FINDINGS: Right Kidney:

Renal measurements: 13 x 5.8 x 7.8 cm = volume: 305 ML. Echogenicity
within normal limits. No hydronephrosis visualized. Multiple <10
simple cysts are in the right kidney largest measures 4.4 x 4.4 x
4.1 cm. No follow-up is necessary.

Left Kidney:

Renal measurements: 10.2 x 4.5 x 5.3 cm = volume: 128 mL.
Echogenicity within normal limits. No mass or hydronephrosis
visualized.

Bladder:

Appears normal for degree of bladder distention.

Other:

None.
IMPRESSION: Multiple simple cysts in the right kidney, no follow-up is
necessary. No acute abnormality identified.

## 2024-01-15 ENCOUNTER — Encounter: Payer: Self-pay | Admitting: Student

## 2024-01-15 ENCOUNTER — Ambulatory Visit: Admitting: Student

## 2024-01-15 VITALS — BP 136/82 | HR 75 | Ht 68.0 in | Wt 189.0 lb

## 2024-01-15 DIAGNOSIS — I5020 Unspecified systolic (congestive) heart failure: Secondary | ICD-10-CM | POA: Insufficient documentation

## 2024-01-15 DIAGNOSIS — Z23 Encounter for immunization: Secondary | ICD-10-CM

## 2024-01-15 DIAGNOSIS — I10 Essential (primary) hypertension: Secondary | ICD-10-CM

## 2024-01-15 DIAGNOSIS — I5022 Chronic systolic (congestive) heart failure: Secondary | ICD-10-CM | POA: Insufficient documentation

## 2024-01-15 DIAGNOSIS — N184 Chronic kidney disease, stage 4 (severe): Secondary | ICD-10-CM | POA: Insufficient documentation

## 2024-01-15 DIAGNOSIS — I340 Nonrheumatic mitral (valve) insufficiency: Secondary | ICD-10-CM

## 2024-01-15 DIAGNOSIS — I110 Hypertensive heart disease with heart failure: Secondary | ICD-10-CM | POA: Diagnosis not present

## 2024-01-15 MED ORDER — HYDROCHLOROTHIAZIDE 25 MG PO TABS: ORAL_TABLET | ORAL | 1 refills | Status: AC

## 2024-01-15 MED ORDER — AMLODIPINE BESYLATE 10 MG PO TABS: 10.0000 mg | ORAL_TABLET | Freq: Every day | ORAL | 1 refills | Status: AC

## 2024-01-15 MED ORDER — LOSARTAN POTASSIUM 100 MG PO TABS: ORAL_TABLET | ORAL | 1 refills | Status: AC

## 2024-01-15 MED ORDER — CARVEDILOL 6.25 MG PO TABS: 6.2500 mg | ORAL_TABLET | Freq: Two times a day (BID) | ORAL | 1 refills | Status: AC

## 2024-01-15 NOTE — Assessment & Plan Note (Deleted)
 GDMT includes losartan , farxiga, and carvedilol . Reprots she is sedentary.

## 2024-01-15 NOTE — Assessment & Plan Note (Signed)
 Has not checked her BP lately. BP today is 136/82. Current mediations are losartan  100 mg daily, hydrochlorothiazide  25 mg daily, amlodipine  10 mg daily, and carvedilol  6.25 mg twice daily and doing well with this. Continue current medications.

## 2024-01-18 DIAGNOSIS — I340 Nonrheumatic mitral (valve) insufficiency: Secondary | ICD-10-CM | POA: Insufficient documentation

## 2024-01-18 DIAGNOSIS — I110 Hypertensive heart disease with heart failure: Secondary | ICD-10-CM | POA: Insufficient documentation

## 2024-01-18 NOTE — Assessment & Plan Note (Signed)
 GDMT includes losartan , farxiga, and carvedilol . Reprots she is sedentary but denies exertional dsypena or chest pain.  Last echo 04/26/2021 mild LV systolic dysfunction with severe LVH, EF 40%, normal r ventricular function. Moderate to severe MR. 04/27/2021: Indeterminate treadmill EKG due to baseline EKG changes with Moderate global LV systolic dysfunction with ejection fraction of 30%. Normal myocardial perfusion without evidence of myocardial ischemia   Last saw cardiology at [facility] in 2023. Denies orthpnea, LE edema or CP today. She will follow up with cardiology.  Continue current medications, refilled today.

## 2024-01-18 NOTE — Assessment & Plan Note (Addendum)
 Moderate to severe MR  with partially mobile, thickened leaflets, and mitral anualr calicafations on Echo from 04/26/2021. Denies exertional dyspnea, chest pain, fatigue or palpitations at this time. Has not seen cardiology since 2023. Will have her f/u with cardiology.

## 2024-01-18 NOTE — Assessment & Plan Note (Signed)
 Followed by Dr. Marcelino at [facility] last GFR 31 in July, stable. Currently on farxiga but having difficultly with insurance nephology office is working on patient assistance and giving her sample. She is currently out of sample. Provided with samples in office today. She is due for follow up with nephrology.

## 2024-01-18 NOTE — Progress Notes (Signed)
 Established Patient Office Visit  Subjective   Patient ID: Alyssa Hunt, female    DOB: [birth year]  Age: 63 y.o. MRN: [id number]  Chief Complaint  Patient presents with   Hypertension    Alyssa Hunt is a 63 y.o. person with medical hx listed below who presents today for transfer care and follow up of hypertension.  Patient Active Problem List   Diagnosis Date Noted   LVH (left ventricular hypertrophy) due to hypertensive disease, with heart failure (HCC) 01/18/2024   Mitral regurgitation 01/18/2024   CKD (chronic kidney disease), stage IV (HCC) 01/15/2024   Special screening for malignant neoplasms, colon    Benign neoplasm of cecum    Benign neoplasm of ascending colon    Benign neoplasm of transverse colon    Essential hypertension 03/10/2015      ROS Refer to HPI    Objective:     Outpatient Encounter Medications as of 01/15/2024  Medication Sig   calcitRIOL (ROCALTROL) 0.25 MCG capsule Take 0.25 mcg by mouth daily.   [DISCONTINUED] amLODipine  (NORVASC ) 10 MG tablet Take 1 tablet (10 mg total) by mouth daily.   [DISCONTINUED] carvedilol  (COREG ) 6.25 MG tablet Take 1 tablet (6.25 mg total) by mouth 2 (two) times daily with a meal.   [DISCONTINUED] hydrochlorothiazide  (HYDRODIURIL ) 25 MG tablet One a day   [DISCONTINUED] losartan  (COZAAR ) 100 MG tablet TAKE 1 TABLET BY MOUTH EVERY DAY ALONG WITH HYDROCHLOROTHIAZIDE  DOSE   amLODipine  (NORVASC ) 10 MG tablet Take 1 tablet (10 mg total) by mouth daily.   carvedilol  (COREG ) 6.25 MG tablet Take 1 tablet (6.25 mg total) by mouth 2 (two) times daily with a meal.   FARXIGA 10 MG TABS tablet Take 10 mg by mouth every morning. lateef (Patient not taking: Reported on 01/15/2024)   hydrochlorothiazide  (HYDRODIURIL ) 25 MG tablet One a day   losartan  (COZAAR ) 100 MG tablet TAKE 1 TABLET BY MOUTH EVERY DAY ALONG WITH HYDROCHLOROTHIAZIDE  DOSE   No facility-administered encounter medications on file as of  01/15/2024.    BP 136/82   Pulse 75   Ht 5' 8 (1.727 m)   Wt 189 lb (85.7 kg)   SpO2 94%   BMI 28.74 kg/m  BP Readings from Last 3 Encounters:  01/15/24 136/82  07/13/23 (!) 138/98  01/23/23 138/84    Physical Exam Constitutional:      Appearance: Normal appearance.  HENT:     Head: Normocephalic and atraumatic.     Mouth/Throat:     Mouth: Mucous membranes are moist.     Pharynx: Oropharynx is clear.  Cardiovascular:     Rate and Rhythm: Normal rate and regular rhythm.     Pulses: Normal pulses.     Heart sounds: No murmur heard.    Comments: No jvd Pulmonary:     Effort: Pulmonary effort is normal.     Breath sounds: No rhonchi or rales.  Abdominal:     General: Abdomen is flat. Bowel sounds are normal. There is no distension.     Palpations: Abdomen is soft.     Tenderness: There is no abdominal tenderness.  Musculoskeletal:        General: Normal range of motion.     Right lower leg: No edema.     Left lower leg: No edema.  Skin:    General: Skin is warm and dry.     Capillary Refill: Capillary refill takes less than 2 seconds.  Neurological:     General: No focal  deficit present.     Mental Status: She is alert and oriented to person, place, and time.  Psychiatric:        Mood and Affect: Mood normal.        Behavior: Behavior normal.        01/15/2024    3:43 PM 01/23/2023    3:31 PM 07/26/2022   11:02 AM  Depression screen PHQ 2/9  Decreased Interest 0 0 0  Down, Depressed, Hopeless 0 0 0  PHQ - 2 Score 0 0 0  Altered sleeping  0 0  Tired, decreased energy  0 0  Change in appetite  0 0  Feeling bad or failure about yourself   0 0  Trouble concentrating  0 0  Moving slowly or fidgety/restless  0 0  Suicidal thoughts  0 0  PHQ-9 Score  0  0   Difficult doing work/chores  Not difficult at all Not difficult at all     Data saved with a previous flowsheet row definition       01/15/2024    3:43 PM 01/23/2023    3:31 PM 07/26/2022   11:02 AM  04/07/2022    9:06 AM  GAD 7 : Generalized Anxiety Score  Nervous, Anxious, on Edge 0 0 0 0  Control/stop worrying 0 0 0 0  Worry too much - different things  0 0 0  Trouble relaxing  0 0 0  Restless  0 0 0  Easily annoyed or irritable  0 0 0  Afraid - awful might happen  0 0 0  Total GAD 7 Score  0 0 0  Anxiety Difficulty  Not difficult at all Not difficult at all Not difficult at all    No results found for any visits on 01/15/24.  Last metabolic panel Lab Results  Component Value Date   GLUCOSE 81 07/13/2023   NA 142 07/13/2023   K 4.6 07/13/2023   CL 104 07/13/2023   CO2 19 (L) 07/13/2023   BUN 43 (H) 07/13/2023   CREATININE 1.74 (H) 07/13/2023   EGFR 33 (L) 07/13/2023   CALCIUM 9.3 07/13/2023   PHOS 4.4 (H) 07/13/2023   PROT 7.0 07/26/2022   ALBUMIN 4.0 07/13/2023   LABGLOB 3.3 07/26/2022   AGRATIO 1.1 07/26/2022   BILITOT 0.4 07/26/2022   ALKPHOS 65 07/26/2022   AST 20 07/26/2022   ALT 18 07/26/2022   ANIONGAP 7 10/05/2021   Last lipids Lab Results  Component Value Date   CHOL 179 07/26/2022   HDL 65 07/26/2022   LDLCALC 99 07/26/2022   TRIG 80 07/26/2022   CHOLHDL 2.3 08/03/2018   Last hemoglobin A1c Lab Results  Component Value Date   HGBA1C 5.9 (H) 07/26/2022    The 10-year ASCVD risk score (Arnett DK, et al., 2019) is: 8.1%    Assessment & Plan:  LVH (left ventricular hypertrophy) due to hypertensive disease, with heart failure (HCC) Assessment & Plan: GDMT includes losartan , farxiga, and carvedilol . Reprots she is sedentary but denies exertional dsypena or chest pain.  Last echo 04/26/2021 mild LV systolic dysfunction with severe LVH, EF 40%, normal r ventricular function. Moderate to severe MR. 04/27/2021: Indeterminate treadmill EKG due to baseline EKG changes with Moderate global LV systolic dysfunction with ejection fraction of 30%. Normal myocardial perfusion without evidence of myocardial ischemia   Last saw cardiology at [facility] in 2023.  Denies orthpnea, LE edema or CP today. She will follow up with cardiology.  Continue current medications,  refilled today.    Encounter for immunization -     Flu vaccine trivalent PF, 6mos and older(Flulaval,Afluria,Fluarix,Fluzone)  Essential hypertension Assessment & Plan: Has not checked her BP lately. BP today is 136/82. Current mediations are losartan  100 mg daily, hydrochlorothiazide  25 mg daily, amlodipine  10 mg daily, and carvedilol  6.25 mg twice daily and doing well with this. Continue current medications.  Orders: -     amLODIPine  Besylate; Take 1 tablet (10 mg total) by mouth daily.  Dispense: 90 tablet; Refill: 1 -     Carvedilol ; Take 1 tablet (6.25 mg total) by mouth 2 (two) times daily with a meal.  Dispense: 180 tablet; Refill: 1 -     hydroCHLOROthiazide ; One a day  Dispense: 90 tablet; Refill: 1 -     Losartan  Potassium; TAKE 1 TABLET BY MOUTH EVERY DAY ALONG WITH HYDROCHLOROTHIAZIDE  DOSE  Dispense: 90 tablet; Refill: 1  CKD (chronic kidney disease), stage IV [facility]) Assessment & Plan: Followed by Dr. Marcelino at [facility] [place] [facility] last GFR 31 in July, stable. Currently on farxiga but having difficultly with insurance nephology office is working on patient assistance and giving her sample. She is currently out of sample. Provided with samples in office today. She is due for follow up with nephrology.    Nonrheumatic mitral valve regurgitation Assessment & Plan: Moderate to severe MR  with partially mobile, thickened leaflets, and mitral anualr calicafations on Echo from 04/26/2021. Denies exertional dyspnea, chest pain, fatigue or palpitations at this time. Has not seen cardiology since 2023. Will have her f/u with cardiology.       Return in about 6 weeks (around 02/26/2024) for physical.    Harlene Saddler, MD

## 2024-02-27 ENCOUNTER — Encounter: Admitting: Student
